# Patient Record
Sex: Male | Born: 1940 | Race: White | Hispanic: No | Smoking: Former smoker
Health system: Southern US, Community
[De-identification: ages and names within clinical notes are randomized; demographics above are authoritative.]

## PROBLEM LIST (undated history)

## (undated) DIAGNOSIS — I4891 Unspecified atrial fibrillation: Secondary | ICD-10-CM

## (undated) DIAGNOSIS — R0609 Other forms of dyspnea: Secondary | ICD-10-CM

## (undated) DIAGNOSIS — J849 Interstitial pulmonary disease, unspecified: Secondary | ICD-10-CM

## (undated) DIAGNOSIS — J449 Chronic obstructive pulmonary disease, unspecified: Secondary | ICD-10-CM

## (undated) DIAGNOSIS — R079 Chest pain, unspecified: Secondary | ICD-10-CM

## (undated) DIAGNOSIS — Z950 Presence of cardiac pacemaker: Secondary | ICD-10-CM

## (undated) DIAGNOSIS — I509 Heart failure, unspecified: Secondary | ICD-10-CM

## (undated) DIAGNOSIS — I10 Essential (primary) hypertension: Secondary | ICD-10-CM

## (undated) DIAGNOSIS — R0600 Dyspnea, unspecified: Secondary | ICD-10-CM

## (undated) HISTORY — PX: CHOLECYSTECTOMY: SHX55

## (undated) HISTORY — DX: Interstitial pulmonary disease, unspecified: J84.9

## (undated) HISTORY — PX: PERMANENT PACEMAKER INSERTION: SHX6023

## (undated) HISTORY — DX: Dyspnea, unspecified: R06.00

## (undated) HISTORY — DX: Presence of cardiac pacemaker: Z95.0

## (undated) HISTORY — DX: Chest pain, unspecified: R07.9

## (undated) HISTORY — DX: Other forms of dyspnea: R06.09

---

## 2012-07-02 ENCOUNTER — Inpatient Hospital Stay (HOSPITAL_COMMUNITY)
Admission: EM | Admit: 2012-07-02 | Discharge: 2012-07-07 | DRG: 291 | Disposition: A | Discharge status: Home / Self Care | Payer: Medicare Other | Attending: Internal Medicine | Admitting: Internal Medicine

## 2012-07-02 ENCOUNTER — Emergency Department (HOSPITAL_COMMUNITY): Payer: Medicare Other

## 2012-07-02 ENCOUNTER — Encounter (HOSPITAL_COMMUNITY): Payer: Self-pay | Admitting: *Deleted

## 2012-07-02 DIAGNOSIS — I10 Essential (primary) hypertension: Secondary | ICD-10-CM | POA: Diagnosis present

## 2012-07-02 DIAGNOSIS — I472 Ventricular tachycardia, unspecified: Secondary | ICD-10-CM | POA: Diagnosis present

## 2012-07-02 DIAGNOSIS — J440 Chronic obstructive pulmonary disease with acute lower respiratory infection: Secondary | ICD-10-CM | POA: Diagnosis present

## 2012-07-02 DIAGNOSIS — I5033 Acute on chronic diastolic (congestive) heart failure: Secondary | ICD-10-CM

## 2012-07-02 DIAGNOSIS — F0390 Unspecified dementia without behavioral disturbance: Secondary | ICD-10-CM | POA: Diagnosis present

## 2012-07-02 DIAGNOSIS — E871 Hypo-osmolality and hyponatremia: Secondary | ICD-10-CM | POA: Diagnosis present

## 2012-07-02 DIAGNOSIS — T45515A Adverse effect of anticoagulants, initial encounter: Secondary | ICD-10-CM | POA: Diagnosis present

## 2012-07-02 DIAGNOSIS — J189 Pneumonia, unspecified organism: Secondary | ICD-10-CM | POA: Diagnosis present

## 2012-07-02 DIAGNOSIS — D649 Anemia, unspecified: Secondary | ICD-10-CM | POA: Diagnosis present

## 2012-07-02 DIAGNOSIS — E876 Hypokalemia: Secondary | ICD-10-CM | POA: Diagnosis present

## 2012-07-02 DIAGNOSIS — I5031 Acute diastolic (congestive) heart failure: Secondary | ICD-10-CM

## 2012-07-02 DIAGNOSIS — I5030 Unspecified diastolic (congestive) heart failure: Principal | ICD-10-CM | POA: Diagnosis present

## 2012-07-02 DIAGNOSIS — Z950 Presence of cardiac pacemaker: Secondary | ICD-10-CM

## 2012-07-02 DIAGNOSIS — J9600 Acute respiratory failure, unspecified whether with hypoxia or hypercapnia: Secondary | ICD-10-CM

## 2012-07-02 DIAGNOSIS — J849 Interstitial pulmonary disease, unspecified: Secondary | ICD-10-CM | POA: Diagnosis present

## 2012-07-02 DIAGNOSIS — D6832 Hemorrhagic disorder due to extrinsic circulating anticoagulants: Secondary | ICD-10-CM

## 2012-07-02 DIAGNOSIS — R0789 Other chest pain: Secondary | ICD-10-CM | POA: Diagnosis present

## 2012-07-02 DIAGNOSIS — J8410 Pulmonary fibrosis, unspecified: Secondary | ICD-10-CM | POA: Diagnosis present

## 2012-07-02 DIAGNOSIS — I4729 Other ventricular tachycardia: Secondary | ICD-10-CM | POA: Diagnosis present

## 2012-07-02 DIAGNOSIS — J209 Acute bronchitis, unspecified: Secondary | ICD-10-CM | POA: Diagnosis present

## 2012-07-02 DIAGNOSIS — I4891 Unspecified atrial fibrillation: Secondary | ICD-10-CM | POA: Diagnosis present

## 2012-07-02 DIAGNOSIS — R791 Abnormal coagulation profile: Secondary | ICD-10-CM | POA: Diagnosis present

## 2012-07-02 DIAGNOSIS — Z22322 Carrier or suspected carrier of Methicillin resistant Staphylococcus aureus: Secondary | ICD-10-CM

## 2012-07-02 DIAGNOSIS — F329 Major depressive disorder, single episode, unspecified: Secondary | ICD-10-CM | POA: Diagnosis present

## 2012-07-02 DIAGNOSIS — Z7901 Long term (current) use of anticoagulants: Secondary | ICD-10-CM

## 2012-07-02 DIAGNOSIS — I509 Heart failure, unspecified: Secondary | ICD-10-CM | POA: Diagnosis present

## 2012-07-02 DIAGNOSIS — M940 Chondrocostal junction syndrome [Tietze]: Secondary | ICD-10-CM | POA: Diagnosis present

## 2012-07-02 DIAGNOSIS — F3289 Other specified depressive episodes: Secondary | ICD-10-CM | POA: Diagnosis present

## 2012-07-02 DIAGNOSIS — Z87891 Personal history of nicotine dependence: Secondary | ICD-10-CM

## 2012-07-02 DIAGNOSIS — J9601 Acute respiratory failure with hypoxia: Secondary | ICD-10-CM | POA: Diagnosis present

## 2012-07-02 HISTORY — DX: Essential (primary) hypertension: I10

## 2012-07-02 HISTORY — DX: Unspecified atrial fibrillation: I48.91

## 2012-07-02 HISTORY — DX: Heart failure, unspecified: I50.9

## 2012-07-02 HISTORY — DX: Chronic obstructive pulmonary disease, unspecified: J44.9

## 2012-07-02 LAB — URINE MICROSCOPIC-ADD ON

## 2012-07-02 LAB — URINALYSIS, ROUTINE W REFLEX MICROSCOPIC
Glucose, UA: NEGATIVE mg/dL
Hgb urine dipstick: NEGATIVE
Ketones, ur: 15 mg/dL — AB
Protein, ur: 30 mg/dL — AB
Urobilinogen, UA: >8 mg/dL — ABNORMAL HIGH (ref 0.0–1.0)

## 2012-07-02 LAB — POCT I-STAT TROPONIN I: Troponin i, poc: 0.01 ng/mL (ref 0.00–0.08)

## 2012-07-02 MED ORDER — ASPIRIN EC 325 MG PO TBEC
325.0000 mg | DELAYED_RELEASE_TABLET | Freq: Once | ORAL | Status: AC
  Administered 2012-07-02: 325 mg via ORAL
  Filled 2012-07-02: qty 1

## 2012-07-02 MED ORDER — FUROSEMIDE 10 MG/ML IJ SOLN
40.0000 mg | Freq: Once | INTRAMUSCULAR | Status: AC
  Administered 2012-07-02: 40 mg via INTRAVENOUS
  Filled 2012-07-02: qty 4

## 2012-07-02 MED ORDER — NITROGLYCERIN IN D5W 200-5 MCG/ML-% IV SOLN
2.0000 ug/min | Freq: Once | INTRAVENOUS | Status: AC
  Administered 2012-07-02: 5 ug/min via INTRAVENOUS
  Filled 2012-07-02: qty 250

## 2012-07-02 MED ORDER — METHYLPREDNISOLONE SODIUM SUCC 125 MG IJ SOLR
125.0000 mg | Freq: Once | INTRAMUSCULAR | Status: AC
  Administered 2012-07-03: 125 mg via INTRAVENOUS
  Filled 2012-07-02: qty 2

## 2012-07-02 MED ORDER — LEVOFLOXACIN IN D5W 750 MG/150ML IV SOLN
750.0000 mg | Freq: Once | INTRAVENOUS | Status: AC
  Administered 2012-07-03: 750 mg via INTRAVENOUS
  Filled 2012-07-02: qty 150

## 2012-07-02 NOTE — ED Notes (Signed)
Family at bedside. 

## 2012-07-02 NOTE — ED Notes (Signed)
MD at bedside. 

## 2012-07-02 NOTE — ED Notes (Signed)
Radiology at bedside

## 2012-07-02 NOTE — ED Provider Notes (Signed)
History     CSN: [id number]  Arrival date & time 07/02/12  2200   First MD Initiated Contact with Patient 07/02/12 2158      Chief Complaint  Patient presents with  . Shortness of Breath    (Consider location/radiation/quality/duration/timing/severity/associated sxs/prior treatment) Patient is a 72 y.o. male presenting with shortness of breath.  Shortness of Breath Severity:  Severe Onset quality:  Gradual Duration:  2 weeks Timing:  Constant Progression:  Worsening Chronicity:  Chronic Context: activity and URI   Relieved by:  Nothing Worsened by:  Exertion, deep breathing and coughing Ineffective treatments:  Inhaler and oxygen Associated symptoms: chest pain and cough   Associated symptoms: no fever, no rash and no sputum production   Chest pain:    Timing:  Intermittent   Progression:  Worsening Risk factors: no hx of PE/DVT and no obesity   Risk factors comment:  COPD, CHF   Past Medical History  Diagnosis Date  . COPD (chronic obstructive pulmonary disease)   . CHF (congestive heart failure)   . Hypertension   . Atrial fibrillation     History reviewed. No pertinent past surgical history.  History reviewed. No pertinent family history.  History  Substance Use Topics  . Smoking status: Not on file  . Smokeless tobacco: Not on file  . Alcohol Use: Not on file    OB History   Grav Para Term Preterm Abortions TAB SAB Ect Mult Living                  Review of Systems  Unable to perform ROS: Acuity of condition  Constitutional: Negative for fever.  HENT: Positive for congestion. Negative for rhinorrhea.   Eyes: Negative for photophobia and visual disturbance.  Respiratory: Positive for cough and shortness of breath. Negative for sputum production.   Cardiovascular: Positive for chest pain. Negative for leg swelling.  Gastrointestinal: Negative for constipation.  Skin: Negative for rash.    Allergies  Penicillins  Home Medications    Current Outpatient Rx  Name  Route  Sig  Dispense  Refill  . Alum & Mag Hydroxide-Simeth (MAGIC MOUTHWASH) SOLN   Oral   Take 15 mLs by mouth every 4 (four) hours as needed (for sore tongue (swish and spit)).         . bumetanide (BUMEX) 2 MG tablet   Oral   Take 2 mg by mouth daily.         . bumetanide (BUMEX) 2 MG tablet   Oral   Take 2 mg by mouth daily as needed. May take additional 2 mg for an increase of 3 lbs. (Take extra potassium, with extra Bumex dose)         . esomeprazole (NEXIUM) 40 MG capsule   Oral   Take 40 mg by mouth daily before breakfast.         . guaiFENesin-codeine (ROBITUSSIN AC) 100-10 MG/5ML syrup   Oral   Take 10 mLs by mouth every 4 (four) hours as needed for cough.         . hydrALAZINE (APRESOLINE) 25 MG tablet   Oral   Take 25 mg by mouth 3 (three) times daily.         Marland Kitchen ipratropium-albuterol (DUONEB) 0.5-2.5 (3) MG/3ML SOLN   Nebulization   Take 3 mLs by nebulization every 4 (four) hours as needed (shortness of breath or wheeze).         . magnesium hydroxide (MILK OF MAGNESIA) 400 MG/5ML  suspension   Oral   Take 30 mLs by mouth daily as needed for constipation (for constipation with oxycodone).         . metoCLOPramide (REGLAN) 10 MG tablet   Oral   Take 5-10 mg by mouth 4 (four) times daily.         . metoprolol succinate (TOPROL-XL) 50 MG 24 hr tablet   Oral   Take 50 mg by mouth 2 (two) times daily. Take with or immediately following a meal.         . oxyCODONE (OXYCONTIN) 10 MG 12 hr tablet   Oral   Take 10 mg by mouth every 4 (four) hours as needed for pain.         . potassium chloride SA (K-DUR,KLOR-CON) 20 MEQ tablet   Oral   Take 40 mEq by mouth daily.         . potassium chloride SA (K-DUR,KLOR-CON) 20 MEQ tablet   Oral   Take 40 mEq by mouth daily as needed (Take an extra 40 meq when taking extra Bumex).         Marland Kitchen sertraline (ZOLOFT) 50 MG tablet   Oral   Take 50 mg by mouth daily.          Marland Kitchen spironolactone (ALDACTONE) 25 MG tablet   Oral   Take 25 mg by mouth daily.         Marland Kitchen warfarin (COUMADIN) 5 MG tablet   Oral   Take 5-7.5 mg by mouth daily. 10 mg on Friday, Saturday, Sunday, and Monday. Take 7.5 mg on Tuesday, Wednesday, and Thursday.           BP 110/59  Pulse 92  Resp 17  SpO2 96%  Physical Exam  Vitals reviewed. Constitutional: She is oriented to person, place, and time. She appears well-developed and well-nourished.  HENT:  Head: Normocephalic and atraumatic.  Right Ear: External ear normal.  Left Ear: External ear normal.  Eyes: Conjunctivae and EOM are normal. Pupils are equal, round, and reactive to light.  Neck: Normal range of motion. Neck supple.  Cardiovascular: Normal rate, regular rhythm, normal heart sounds and intact distal pulses.   Pulmonary/Chest: Accessory muscle usage present. Tachypnea noted. She is in respiratory distress. She has rales in the right middle field, the right lower field, the left middle field and the left lower field. She exhibits tenderness.  Abdominal: Soft. Bowel sounds are normal. There is no tenderness.  Musculoskeletal: Normal range of motion.  Neurological: She is alert and oriented to person, place, and time.  Skin: Skin is warm and dry.    ED Course  Procedures (including critical care time)  Labs Reviewed  CBC WITH DIFFERENTIAL - Abnormal; Notable for the following:    RBC 3.47 (*)    Hemoglobin 11.2 (*)    HCT 33.3 (*)    Neutrophils Relative % 83 (*)    Neutro Abs 8.6 (*)    Lymphocytes Relative 8 (*)    All other components within normal limits  COMPREHENSIVE METABOLIC PANEL - Abnormal; Notable for the following:    Sodium 129 (*)    Potassium 3.2 (*)    Chloride 93 (*)    Glucose, Bld 106 (*)    Albumin 2.9 (*)    Total Bilirubin 1.3 (*)    All other components within normal limits  PRO B NATRIURETIC PEPTIDE - Abnormal; Notable for the following:    Pro B Natriuretic peptide (BNP)  3078.0 (*)  All other components within normal limits  URINALYSIS, ROUTINE W REFLEX MICROSCOPIC - Abnormal; Notable for the following:    Color, Urine AMBER (*)    Bilirubin Urine MODERATE (*)    Ketones, ur 15 (*)    Protein, ur 30 (*)    Urobilinogen, UA >8.0 (*)    All other components within normal limits  URINE MICROSCOPIC-ADD ON - Abnormal; Notable for the following:    Squamous Epithelial / LPF FEW (*)    Bacteria, UA FEW (*)    All other components within normal limits  POCT I-STAT TROPONIN I  CG4 I-STAT (LACTIC ACID)   Dg Chest Port 1 View  07/02/2012   *RADIOLOGY REPORT*  Clinical Data: Shortness of breath.  Respiratory distress.  PORTABLE CHEST - 1 VIEW  Comparison: No priors.  Findings: Mild diffuse interstitial prominence and extensive peribronchial cuffing.  Bibasilar opacities favored to predominately reflect subsegmental atelectasis.  No definite pleural effusions.  Mild congestion of the pulmonary vasculature. Heart size is mildly enlarged. The patient is rotated to the left on today's exam, resulting in distortion of the mediastinal contours and reduced diagnostic sensitivity and specificity for mediastinal pathology.  Atherosclerosis in the thoracic aorta. Left-sided pacemaker device in place with lead tip projecting over the expected location of the right ventricular apex.  IMPRESSION: 1.  Diffuse peribronchial cuffing and interstitial prominence, concerning for severe bronchitis, potentially with developing multifocal bronchopneumonia. 2.  Mild cardiomegaly with pulmonary venous congestion. 3.  Atherosclerosis.   Original Report Authenticated By: Trudie Reed, M.D.     No diagnosis found.   Date: 07/02/2012  Rate: 107  Rhythm: atrial fibrillation  QRS Axis: left  Intervals: qt normal  ST/T Wave abnormalities: normal  Conduction Disutrbances:none  Narrative Interpretation:   Old EKG Reviewed: none available    MDM  72 y.o. male  with pertinent PMH of  afib, COPD, CHF, HTN presents with acute exacerbation of chronic dyspnea for 2 weeks.  Pt states he is compliant with medication, denies fever, gi symptoms, or other symptoms.  Symptoms acutely worsened today, was prescribed albuterol inhaler, but this did not help his symptoms.  On EMS arrival, pt tachypneic, in respiratory distress, pale.  Pt placed on cpap, symptoms mildly improved.  On arrival vitals and physical exam as above with rales bilaterally to mid lung fields, no wheezing.  Pt did have JVD but no pedal edema.  Symptoms of gradual onset.  Doubt PE given clinical scenario, and pt improved with nitro, bipap, and lasix.  BNP elevated.  Initial trop negative.  Given levaquin after cxr demonstrated possible bronchopneumonia.  Also solumedrol given CXR more consistent with COPD exacerbation.  Admitted without change in condition.   Labs and imaging as above reviewed by myself and attending,Dr. Jeraldine Loots, with whom case was discussed.   Clinical impression: Dyspnea CHF exacerbation        Noel Gerold, MD 07/03/12 0300

## 2012-07-02 NOTE — ED Notes (Signed)
Per EMS: pt coming from home with c/o increased shortness of breath. Pt was lying in bed, pale, diaphoretic, severe distress, pt unable to move. Per EMS: rales in all lungs, diminished in lower lobes. Pt had a change in heart rhythm en route, rhythm was A-fib now demand paced. Pt has a pacemaker. Pt placed on CPAP at 2110. Pt's presentation has improved but pt states he does not feel any better. Pt is A&Ox4, respirations are labored. Pt has hx of CHF, COPD. Skin warm and dry

## 2012-07-03 ENCOUNTER — Encounter (HOSPITAL_COMMUNITY): Payer: Self-pay | Admitting: Internal Medicine

## 2012-07-03 ENCOUNTER — Inpatient Hospital Stay (HOSPITAL_COMMUNITY): Payer: Medicare Other

## 2012-07-03 DIAGNOSIS — I509 Heart failure, unspecified: Secondary | ICD-10-CM

## 2012-07-03 DIAGNOSIS — I4891 Unspecified atrial fibrillation: Secondary | ICD-10-CM

## 2012-07-03 DIAGNOSIS — E871 Hypo-osmolality and hyponatremia: Secondary | ICD-10-CM | POA: Diagnosis present

## 2012-07-03 DIAGNOSIS — I5031 Acute diastolic (congestive) heart failure: Secondary | ICD-10-CM

## 2012-07-03 DIAGNOSIS — D6832 Hemorrhagic disorder due to extrinsic circulating anticoagulants: Secondary | ICD-10-CM

## 2012-07-03 DIAGNOSIS — E876 Hypokalemia: Secondary | ICD-10-CM | POA: Diagnosis present

## 2012-07-03 DIAGNOSIS — I10 Essential (primary) hypertension: Secondary | ICD-10-CM | POA: Diagnosis present

## 2012-07-03 DIAGNOSIS — R0789 Other chest pain: Secondary | ICD-10-CM | POA: Diagnosis present

## 2012-07-03 DIAGNOSIS — J9601 Acute respiratory failure with hypoxia: Secondary | ICD-10-CM | POA: Diagnosis present

## 2012-07-03 DIAGNOSIS — J209 Acute bronchitis, unspecified: Secondary | ICD-10-CM | POA: Diagnosis present

## 2012-07-03 DIAGNOSIS — I369 Nonrheumatic tricuspid valve disorder, unspecified: Secondary | ICD-10-CM

## 2012-07-03 LAB — CBC WITH DIFFERENTIAL/PLATELET
Basophils Absolute: 0 10*3/uL (ref 0.0–0.1)
Basophils Absolute: 0 10*3/uL (ref 0.0–0.1)
Basophils Relative: 0 % (ref 0–1)
Eosinophils Absolute: 0 10*3/uL (ref 0.0–0.7)
Eosinophils Relative: 0 % (ref 0–5)
HCT: 33.3 % — ABNORMAL LOW (ref 39.0–52.0)
HCT: 32.3 % — ABNORMAL LOW (ref 39.0–52.0)
Lymphocytes Relative: 8 % — ABNORMAL LOW (ref 12–46)
Lymphs Abs: 0.8 10*3/uL (ref 0.7–4.0)
MCH: 32.3 pg (ref 26.0–34.0)
MCHC: 33.7 g/dL (ref 30.0–36.0)
MCV: 96 fL (ref 78.0–100.0)
Monocytes Absolute: 0.9 10*3/uL (ref 0.1–1.0)
Monocytes Absolute: 0.4 10*3/uL (ref 0.1–1.0)
Neutro Abs: 8.6 10*3/uL — ABNORMAL HIGH (ref 1.7–7.7)
Neutro Abs: 8.2 10*3/uL — ABNORMAL HIGH (ref 1.7–7.7)
Neutrophils Relative %: 90 % — ABNORMAL HIGH (ref 43–77)
RBC: 3.47 MIL/uL — ABNORMAL LOW (ref 4.22–5.81)
RDW: 14.6 % (ref 11.5–15.5)
WBC: 10.3 10*3/uL (ref 4.0–10.5)

## 2012-07-03 LAB — PROTIME-INR: Prothrombin Time: 63.2 seconds — ABNORMAL HIGH (ref 11.6–15.2)

## 2012-07-03 LAB — COMPREHENSIVE METABOLIC PANEL
ALT: 11 U/L (ref 0–53)
ALT: 13 U/L (ref 0–53)
AST: 32 U/L (ref 0–37)
Alkaline Phosphatase: 120 U/L — ABNORMAL HIGH (ref 39–117)
CO2: 23 mEq/L (ref 19–32)
CO2: 21 mEq/L (ref 19–32)
Calcium: 9.1 mg/dL (ref 8.4–10.5)
Chloride: 93 mEq/L — ABNORMAL LOW (ref 96–112)
GFR calc Af Amer: >90 mL/min (ref >90)
GFR calc Af Amer: >90 mL/min (ref >90)
GFR calc non Af Amer: >90 mL/min (ref >90)
GFR calc non Af Amer: >90 mL/min (ref >90)
Glucose, Bld: 106 mg/dL — ABNORMAL HIGH (ref 70–99)
Glucose, Bld: 157 mg/dL — ABNORMAL HIGH (ref 70–99)
Potassium: 3.4 mEq/L — ABNORMAL LOW (ref 3.5–5.1)
Sodium: 129 mEq/L — ABNORMAL LOW (ref 135–145)
Sodium: 130 mEq/L — ABNORMAL LOW (ref 135–145)
Total Bilirubin: 1.3 mg/dL — ABNORMAL HIGH (ref 0.3–1.2)

## 2012-07-03 LAB — POCT I-STAT 3, ART BLOOD GAS (G3+)
Bicarbonate: 24.5 mEq/L — ABNORMAL HIGH (ref 20.0–24.0)
O2 Saturation: 99 %

## 2012-07-03 LAB — PHOSPHORUS: Phosphorus: 2.9 mg/dL (ref 2.3–4.6)

## 2012-07-03 MED ORDER — SODIUM CHLORIDE 0.9 % IJ SOLN
3.0000 mL | Freq: Two times a day (BID) | INTRAMUSCULAR | Status: DC
  Administered 2012-07-03 – 2012-07-07 (×8): 3 mL via INTRAVENOUS

## 2012-07-03 MED ORDER — POTASSIUM CHLORIDE CRYS ER 20 MEQ PO TBCR
40.0000 meq | EXTENDED_RELEASE_TABLET | Freq: Every day | ORAL | Status: DC
  Administered 2012-07-03 – 2012-07-04 (×2): 40 meq via ORAL
  Filled 2012-07-03 (×2): qty 2

## 2012-07-03 MED ORDER — OXYCODONE HCL 5 MG PO TABS
10.0000 mg | ORAL_TABLET | ORAL | Status: DC | PRN
  Administered 2012-07-03 – 2012-07-07 (×11): 10 mg via ORAL
  Filled 2012-07-03 (×2): qty 2
  Filled 2012-07-03: qty 1
  Filled 2012-07-03: qty 2
  Filled 2012-07-03 (×2): qty 1
  Filled 2012-07-03: qty 2
  Filled 2012-07-03: qty 1
  Filled 2012-07-03: qty 2
  Filled 2012-07-03: qty 1
  Filled 2012-07-03 (×3): qty 2

## 2012-07-03 MED ORDER — IBUPROFEN 400 MG PO TABS
400.0000 mg | ORAL_TABLET | Freq: Four times a day (QID) | ORAL | Status: DC | PRN
  Filled 2012-07-03: qty 1

## 2012-07-03 MED ORDER — NITROGLYCERIN IN D5W 200-5 MCG/ML-% IV SOLN
2.0000 ug/min | INTRAVENOUS | Status: DC
  Administered 2012-07-03: 20 ug/min via INTRAVENOUS
  Administered 2012-07-03: 15 ug/min via INTRAVENOUS
  Administered 2012-07-03: 25 ug/min via INTRAVENOUS
  Administered 2012-07-03: 10 ug/min via INTRAVENOUS
  Administered 2012-07-03: 5 ug/min via INTRAVENOUS

## 2012-07-03 MED ORDER — ACETAMINOPHEN 650 MG RE SUPP: 650.0000 mg | Freq: Four times a day (QID) | RECTAL | Status: DC | PRN

## 2012-07-03 MED ORDER — CHLORHEXIDINE GLUCONATE CLOTH 2 % EX PADS
6.0000 | MEDICATED_PAD | Freq: Every day | CUTANEOUS | Status: AC
  Administered 2012-07-03 – 2012-07-07 (×5): 6 via TOPICAL

## 2012-07-03 MED ORDER — IPRATROPIUM BROMIDE 0.02 % IN SOLN
0.5000 mg | Freq: Four times a day (QID) | RESPIRATORY_TRACT | Status: DC
  Administered 2012-07-03 – 2012-07-04 (×5): 0.5 mg via RESPIRATORY_TRACT
  Filled 2012-07-03 (×5): qty 2.5

## 2012-07-03 MED ORDER — MUPIROCIN 2 % EX OINT
1.0000 "application " | TOPICAL_OINTMENT | Freq: Two times a day (BID) | CUTANEOUS | Status: DC
  Administered 2012-07-03 – 2012-07-07 (×9): 1 via NASAL
  Filled 2012-07-03 (×2): qty 22

## 2012-07-03 MED ORDER — HYDROCODONE-HOMATROPINE 5-1.5 MG/5ML PO SYRP
5.0000 mL | ORAL_SOLUTION | ORAL | Status: DC | PRN
  Administered 2012-07-03 – 2012-07-05 (×3): 5 mL via ORAL
  Filled 2012-07-03 (×4): qty 5

## 2012-07-03 MED ORDER — WARFARIN - PHARMACIST DOSING INPATIENT
Freq: Every day | Status: DC
  Administered 2012-07-06: 18:00:00

## 2012-07-03 MED ORDER — METOCLOPRAMIDE HCL 5 MG PO TABS
5.0000 mg | ORAL_TABLET | Freq: Three times a day (TID) | ORAL | Status: DC
  Administered 2012-07-03: 5 mg via ORAL
  Administered 2012-07-03: 10 mg via ORAL
  Administered 2012-07-03 (×2): 5 mg via ORAL
  Administered 2012-07-04: 10 mg via ORAL
  Administered 2012-07-04: 5 mg via ORAL
  Administered 2012-07-04: 10 mg via ORAL
  Administered 2012-07-04: 5 mg via ORAL
  Administered 2012-07-05: 10 mg via ORAL
  Filled 2012-07-03 (×13): qty 2

## 2012-07-03 MED ORDER — LEVALBUTEROL HCL 0.63 MG/3ML IN NEBU
0.6300 mg | INHALATION_SOLUTION | Freq: Four times a day (QID) | RESPIRATORY_TRACT | Status: DC
  Administered 2012-07-03 – 2012-07-04 (×5): 0.63 mg via RESPIRATORY_TRACT
  Filled 2012-07-03 (×6): qty 3

## 2012-07-03 MED ORDER — HYDRALAZINE HCL 25 MG PO TABS
25.0000 mg | ORAL_TABLET | Freq: Three times a day (TID) | ORAL | Status: DC
  Administered 2012-07-03 – 2012-07-07 (×13): 25 mg via ORAL
  Filled 2012-07-03 (×17): qty 1

## 2012-07-03 MED ORDER — ONDANSETRON HCL 4 MG/2ML IJ SOLN: 4.0000 mg | Freq: Four times a day (QID) | INTRAMUSCULAR | Status: DC | PRN

## 2012-07-03 MED ORDER — FUROSEMIDE 10 MG/ML IJ SOLN
40.0000 mg | Freq: Four times a day (QID) | INTRAMUSCULAR | Status: DC
  Administered 2012-07-03: 40 mg via INTRAVENOUS
  Filled 2012-07-03 (×5): qty 4

## 2012-07-03 MED ORDER — ACETAMINOPHEN 325 MG PO TABS: 650.0000 mg | ORAL_TABLET | Freq: Four times a day (QID) | ORAL | Status: DC | PRN

## 2012-07-03 MED ORDER — GI COCKTAIL ~~LOC~~
30.0000 mL | Freq: Once | ORAL | Status: AC
  Administered 2012-07-03: 30 mL via ORAL
  Filled 2012-07-03: qty 30

## 2012-07-03 MED ORDER — PANTOPRAZOLE SODIUM 40 MG PO TBEC
40.0000 mg | DELAYED_RELEASE_TABLET | Freq: Every day | ORAL | Status: DC
  Administered 2012-07-03 – 2012-07-07 (×5): 40 mg via ORAL
  Filled 2012-07-03 (×5): qty 1

## 2012-07-03 MED ORDER — METOPROLOL SUCCINATE ER 50 MG PO TB24
50.0000 mg | ORAL_TABLET | Freq: Two times a day (BID) | ORAL | Status: DC
  Administered 2012-07-03 – 2012-07-04 (×3): 50 mg via ORAL
  Filled 2012-07-03 (×4): qty 1

## 2012-07-03 MED ORDER — FUROSEMIDE 10 MG/ML IJ SOLN
40.0000 mg | Freq: Three times a day (TID) | INTRAMUSCULAR | Status: DC
  Administered 2012-07-03 – 2012-07-04 (×3): 40 mg via INTRAVENOUS
  Filled 2012-07-03 (×3): qty 4

## 2012-07-03 MED ORDER — LEVOFLOXACIN IN D5W 750 MG/150ML IV SOLN
750.0000 mg | INTRAVENOUS | Status: DC
  Administered 2012-07-03 – 2012-07-05 (×3): 750 mg via INTRAVENOUS
  Filled 2012-07-03 (×4): qty 150

## 2012-07-03 MED ORDER — SERTRALINE HCL 50 MG PO TABS
50.0000 mg | ORAL_TABLET | Freq: Every day | ORAL | Status: DC
  Administered 2012-07-03 – 2012-07-07 (×5): 50 mg via ORAL
  Filled 2012-07-03 (×5): qty 1

## 2012-07-03 MED ORDER — ONDANSETRON HCL 4 MG PO TABS: 4.0000 mg | ORAL_TABLET | Freq: Four times a day (QID) | ORAL | Status: DC | PRN

## 2012-07-03 MED ORDER — SODIUM CHLORIDE 0.9 % IJ SOLN
3.0000 mL | Freq: Two times a day (BID) | INTRAMUSCULAR | Status: DC
  Administered 2012-07-03 – 2012-07-04 (×2): 3 mL via INTRAVENOUS

## 2012-07-03 MED ORDER — IOHEXOL 300 MG/ML  SOLN
80.0000 mL | Freq: Once | INTRAMUSCULAR | Status: AC | PRN
  Administered 2012-07-03: 80 mL via INTRAVENOUS

## 2012-07-03 MED ORDER — LEVALBUTEROL HCL 0.63 MG/3ML IN NEBU
0.6300 mg | INHALATION_SOLUTION | Freq: Four times a day (QID) | RESPIRATORY_TRACT | Status: DC | PRN
  Filled 2012-07-03: qty 3

## 2012-07-03 MED ORDER — SPIRONOLACTONE 25 MG PO TABS
25.0000 mg | ORAL_TABLET | Freq: Every day | ORAL | Status: DC
  Administered 2012-07-03 – 2012-07-05 (×3): 25 mg via ORAL
  Filled 2012-07-03 (×4): qty 1

## 2012-07-03 NOTE — Progress Notes (Signed)
Echocardiogram 2D Echocardiogram has been performed.  Arvil Chaco 07/03/2012, 4:02 PM

## 2012-07-03 NOTE — ED Notes (Signed)
BiPap dc'd per Dr. Kirtland Bouchard.

## 2012-07-03 NOTE — ED Provider Notes (Signed)
I saw and evaluated the patient.  I agree with the resident's note (Dr. Littie Deeds)  This patient presents in extremis, actively in respiratory failure.  He was receiving continuous positive airway pressure via EMS assistance device. With the initial presence of tachypnea, tachycardia, oxygen reliance, there is suspicion for either CHF exacerbation or infectious process.  The patient's emergency room course he improved following provision of continuous noninvasive mechanical ventilation, and initiation of albuterol treatments, nitroglycerin via IV, and a nitroglycerin drip.  Following the patient's chest x-ray, the suspicion of infectious etiology with seemed likely as well, and he was provided antibiotics. On multiple repeat examinations  he continually improved.  However, he continued to receive supplemental oxygen throughout. With negative troponin, minimal risk factors for PE, both ischemic cardiac disease and thromboembolic processes seem less likely.  Patient did require admission for further evaluation and management for his presentation of acute respiratory failure.    I saw all relevant labs and ECG, agree with the interpretation.     CRITICAL CARE Performed by: Gerhard Munch Total critical care time: 35 Critical care time was exclusive of separately billable procedures and treating other patients. Critical care was necessary to treat or prevent imminent or life-threatening deterioration. Critical care was time spent personally by me on the following activities: development of treatment plan with patient and/or surrogate as well as nursing, discussions with consultants, evaluation of patient's response to treatment, examination of patient, obtaining history from patient or surrogate, ordering and performing treatments and interventions, ordering and review of laboratory studies, ordering and review of radiographic studies, pulse oximetry and re-evaluation of patient's  condition.   Gerhard Munch, MD 07/04/12 0000

## 2012-07-03 NOTE — Progress Notes (Signed)
Utilization Review Completed. 07/03/2012

## 2012-07-03 NOTE — Progress Notes (Signed)
Paged A. Rennis Harding, NP regarding pt having NTG gtt running at 74mcg/hr with no current orders to do so.  Pt complaining of Chest pressure 10/10 in midsternal area.  New orders placed in EPIC, will continue to monitor.  Salomon Mast, RN

## 2012-07-03 NOTE — H&P (Signed)
[facility] History and Physical  Billy Casey ZOX:[id number] DOB: January 23, 1940 DOA: 07/02/2012  Referring physician: ER physician. PCP: No primary provider on file. Billy Casey in [facility].  Chief Complaint: Shortness of breath.  HPI: Billy Casey is a 72 y.o. male history of A. fib, COPD, hypertension has been experiencing shortness of breath with productive cough for last 3 days. Patient's symptoms has been progressing getting worse. Patient also had some chest pressure yesterday morning which was self-limited and patient's chest pressure started having again in the evening at that time patient presented the ER. In the ER patient was found to be acutely short of breath and was placed on BiPAP and chest x-ray showed features concerning for multifocal pneumonia with possible CHF. Patient also was found to be in A. fib with mildly elevated heart rate. Patient was given one dose of Lasix IV 40 mg and started on Levaquin for pneumonia. On my exam patient felt better and ABG showed no carbon dioxide retention and patient was taken off BiPAP. Patient is still mildly short of breath and will be closely monitored overnight in step down. Denies any chest pain at this time.  Review of Systems: As presented in the history of presenting illness, rest negative.  Past Medical History  Diagnosis Date  . COPD (chronic obstructive pulmonary disease)   . CHF (congestive heart failure)   . Hypertension   . Atrial fibrillation    Past Surgical History  Procedure Laterality Date  . Cholecystectomy    . Permanent pacemaker insertion     Social History:  reports that she has never smoked. She does not have any smokeless tobacco history on file. She reports that she does not drink alcohol or use illicit drugs. Home. where does patient live-- Can do ADLs. Can patient participate in ADLs?  Allergies  Allergen Reactions  . Penicillins     Family History  Problem Relation Age of  Onset  . Stroke Mother   . CAD Father       Prior to Admission medications   Medication Sig Start Date End Date Taking? Authorizing Provider  Alum & Mag Hydroxide-Simeth (MAGIC MOUTHWASH) SOLN Take 15 mLs by mouth every 4 (four) hours as needed (for sore tongue (swish and spit)).   Yes Historical Provider, MD  bumetanide (BUMEX) 2 MG tablet Take 2 mg by mouth daily.   Yes Historical Provider, MD  bumetanide (BUMEX) 2 MG tablet Take 2 mg by mouth daily as needed. May take additional 2 mg for an increase of 3 lbs. (Take extra potassium, with extra Bumex dose)   Yes Historical Provider, MD  esomeprazole (NEXIUM) 40 MG capsule Take 40 mg by mouth daily before breakfast.   Yes Historical Provider, MD  guaiFENesin-codeine (ROBITUSSIN AC) 100-10 MG/5ML syrup Take 10 mLs by mouth every 4 (four) hours as needed for cough.   Yes Historical Provider, MD  hydrALAZINE (APRESOLINE) 25 MG tablet Take 25 mg by mouth 3 (three) times daily.   Yes Historical Provider, MD  ipratropium-albuterol (DUONEB) 0.5-2.5 (3) MG/3ML SOLN Take 3 mLs by nebulization every 4 (four) hours as needed (shortness of breath or wheeze).   Yes Historical Provider, MD  magnesium hydroxide (MILK OF MAGNESIA) 400 MG/5ML suspension Take 30 mLs by mouth daily as needed for constipation (for constipation with oxycodone).   Yes Historical Provider, MD  metoCLOPramide (REGLAN) 10 MG tablet Take 5-10 mg by mouth 4 (four) times daily.   Yes Historical Provider, MD  metoprolol  succinate (TOPROL-XL) 50 MG 24 hr tablet Take 50 mg by mouth 2 (two) times daily. Take with or immediately following a meal.   Yes Historical Provider, MD  oxyCODONE (OXYCONTIN) 10 MG 12 hr tablet Take 10 mg by mouth every 4 (four) hours as needed for pain.   Yes Historical Provider, MD  potassium chloride SA (K-DUR,KLOR-CON) 20 MEQ tablet Take 40 mEq by mouth daily.   Yes Historical Provider, MD  potassium chloride SA (K-DUR,KLOR-CON) 20 MEQ tablet Take 40 mEq by mouth daily  as needed (Take an extra 40 meq when taking extra Bumex).   Yes Historical Provider, MD  sertraline (ZOLOFT) 50 MG tablet Take 50 mg by mouth daily.   Yes Historical Provider, MD  spironolactone (ALDACTONE) 25 MG tablet Take 25 mg by mouth daily.   Yes Historical Provider, MD  warfarin (COUMADIN) 5 MG tablet Take 5-7.5 mg by mouth daily. 10 mg on Friday, Saturday, Sunday, and Monday. Take 7.5 mg on Tuesday, Wednesday, and Thursday.   Yes Historical Provider, MD   Physical Exam: Filed Vitals:   07/02/12 2250 07/02/12 2300 07/02/12 2310 07/03/12 0017  BP: 146/69 99/50 110/59 124/62  Pulse: 89 98 92 95  Resp: 20 23 17    SpO2: 96% 96% 96% 99%     General:  Well-developed and nourished.  Eyes: Anicteric no pallor.  ENT: No discharge from the ears eyes nose mouth.  Neck: No mass felt.  Cardiovascular: S1-S2 heard.   Respiratory: No rhonchi or crepitations.  Abdomen: Soft nontender bowel sounds present.  Skin: No rash.  Musculoskeletal: No edema.  Psychiatric: Appears normal.  Neurologic: Alert awake oriented to time place and person. Moves all extremities.  Labs on Admission:  Basic Metabolic Panel:  Recent Labs Lab 07/02/12 2201  NA 129*  K 3.2*  CL 93*  CO2 23  GLUCOSE 106*  BUN 9  CREATININE 0.50  CALCIUM 9.1   Liver Function Tests:  Recent Labs Lab 07/02/12 2201  AST 32  ALT 11  ALKPHOS 104  BILITOT 1.3*  PROT 6.5  ALBUMIN 2.9*   No results found for this basename: LIPASE, AMYLASE,  in the last 168 hours No results found for this basename: AMMONIA,  in the last 168 hours CBC:  Recent Labs Lab 07/02/12 2201  WBC 10.3  NEUTROABS 8.6*  HGB 11.2*  HCT 33.3*  MCV 96.0  PLT 245   Cardiac Enzymes: No results found for this basename: CKTOTAL, CKMB, CKMBINDEX, TROPONINI,  in the last 168 hours  BNP (last 3 results)  Recent Labs  07/02/12 2201  PROBNP 3078.0*   CBG: No results found for this basename: GLUCAP,  in the last 168  hours  Radiological Exams on Admission: Dg Chest Port 1 View  07/02/2012   *RADIOLOGY REPORT*  Clinical Data: Shortness of breath.  Respiratory distress.  PORTABLE CHEST - 1 VIEW  Comparison: No priors.  Findings: Mild diffuse interstitial prominence and extensive peribronchial cuffing.  Bibasilar opacities favored to predominately reflect subsegmental atelectasis.  No definite pleural effusions.  Mild congestion of the pulmonary vasculature. Heart size is mildly enlarged. The patient is rotated to the left on today's exam, resulting in distortion of the mediastinal contours and reduced diagnostic sensitivity and specificity for mediastinal pathology.  Atherosclerosis in the thoracic aorta. Left-sided pacemaker device in place with lead tip projecting over the expected location of the right ventricular apex.  IMPRESSION: 1.  Diffuse peribronchial cuffing and interstitial prominence, concerning for severe bronchitis, potentially with developing  multifocal bronchopneumonia. 2.  Mild cardiomegaly with pulmonary venous congestion. 3.  Atherosclerosis.   Original Report Authenticated By: Trudie Reed, M.D.    EKG: Independently reviewed. A. fib with RVR.  Assessment/Plan Principal Problem:   Acute respiratory failure Active Problems:   Pneumonia   CHF (congestive heart failure)   Atrial fibrillation with RVR   1. Acute respiratory failure - most likely secondary to a combination of pneumonia and CHF. Patient will be continued on Levaquin and IV Lasix 40 mg every 6 hourly. Check 2-D echo and cardiac markers. Check urine Legionella antigen and strep antigen. 2. A. Fib - heart rate is mildly elevated. Closely monitor in telemetry. Check cardiac enzymes and thyroid function tests. Coumadin per pharmacy. 3. COPD - presently not wheezing. 4. Anemia - baseline hemoglobin not known. Closely follow CBC. 5. Hypertension - continue home medications.    Code Status: Full code.  Family Communication:  Patient's daughter at the bedside.  Disposition Plan: Admit to inpatient.    Billy Casey N. [facility] Pager [phone].  If 7PM-7AM, please contact night-coverage [email or web address] Password [email or web address] 07/03/2012, 1:11 AM

## 2012-07-03 NOTE — Progress Notes (Addendum)
ANTICOAGULATION CONSULT NOTE - Initial Consult  Pharmacy Consult for Levaquin and Coumadin Indication: r/o PNA  and atrial fibrillation  Allergies  Allergen Reactions  . Penicillins     Patient Measurements: Height: 6' (182.9 cm) Weight: 26 lb 3.8 oz (11.9 kg) IBW/kg (Calculated) : 73.1  Vital Signs: Temp: 97.1 F (36.2 C) (06/17 0200) Temp src: Oral (06/17 0200) BP: 100/58 mmHg (06/17 0114) Pulse Rate: 90 (06/17 0114)  Labs:  Recent Labs  07/02/12 2201  HGB 11.2*  HCT 33.3*  PLT 245  CREATININE 0.50    Estimated Creatinine Clearance: 12.3 ml/min (by C-G formula based on Cr of 0.5).   Medical History: Past Medical History  Diagnosis Date  . COPD (chronic obstructive pulmonary disease)   . CHF (congestive heart failure)   . Hypertension   . Atrial fibrillation     Medications:  Prescriptions prior to admission  Medication Sig Dispense Refill  . Alum & Mag Hydroxide-Simeth (MAGIC MOUTHWASH) SOLN Take 15 mLs by mouth every 4 (four) hours as needed (for sore tongue (swish and spit)).      . bumetanide (BUMEX) 2 MG tablet Take 2 mg by mouth daily.      . bumetanide (BUMEX) 2 MG tablet Take 2 mg by mouth daily as needed. May take additional 2 mg for an increase of 3 lbs. (Take extra potassium, with extra Bumex dose)      . esomeprazole (NEXIUM) 40 MG capsule Take 40 mg by mouth daily before breakfast.      . guaiFENesin-codeine (ROBITUSSIN AC) 100-10 MG/5ML syrup Take 10 mLs by mouth every 4 (four) hours as needed for cough.      . hydrALAZINE (APRESOLINE) 25 MG tablet Take 25 mg by mouth 3 (three) times daily.      Marland Kitchen ipratropium-albuterol (DUONEB) 0.5-2.5 (3) MG/3ML SOLN Take 3 mLs by nebulization every 4 (four) hours as needed (shortness of breath or wheeze).      . magnesium hydroxide (MILK OF MAGNESIA) 400 MG/5ML suspension Take 30 mLs by mouth daily as needed for constipation (for constipation with oxycodone).      . metoCLOPramide (REGLAN) 10 MG tablet Take  5-10 mg by mouth 4 (four) times daily.      . metoprolol succinate (TOPROL-XL) 50 MG 24 hr tablet Take 50 mg by mouth 2 (two) times daily. Take with or immediately following a meal.      . oxyCODONE (OXYCONTIN) 10 MG 12 hr tablet Take 10 mg by mouth every 4 (four) hours as needed for pain.      . potassium chloride SA (K-DUR,KLOR-CON) 20 MEQ tablet Take 40 mEq by mouth daily.      . potassium chloride SA (K-DUR,KLOR-CON) 20 MEQ tablet Take 40 mEq by mouth daily as needed (Take an extra 40 meq when taking extra Bumex).      Marland Kitchen sertraline (ZOLOFT) 50 MG tablet Take 50 mg by mouth daily.      Marland Kitchen spironolactone (ALDACTONE) 25 MG tablet Take 25 mg by mouth daily.      Marland Kitchen warfarin (COUMADIN) 5 MG tablet Take 5-7.5 mg by mouth daily. 10 mg on Friday, Saturday, Sunday, and Monday. Take 7.5 mg on Tuesday, Wednesday, and Thursday.        Assessment: 72 yo male admitted with SOB for Levaquin, h/o Afib to continue Coumadin   Goal of Therapy:  INR 2-3 Monitor platelets by anticoagulation protocol: Yes   Plan:  Levaquin 750 mg IV q24h Daily INR  Abbott, Earl Lites  [place] 07/03/2012,2:13 AM  Addendum: INR reported as 8.37 which is above goal. No overt bleeding noted.   Plan: 1. No coumadin today 2. F/u AM INR  Lysle Pearl, PharmD, BCPS Pager # [phone] 07/03/2012 10:15 AM

## 2012-07-03 NOTE — Progress Notes (Signed)
Patient taken of Bipap per MD and tolerating well.  RT will continue to monitor patient.

## 2012-07-03 NOTE — Progress Notes (Signed)
[facility] Progress Note [place] TEAM 1 - Stepdown/ICU TEAM   Blane Worthington Isaacson ZOX:[id number] DOB: 27-Oct-1940 DOA: 07/02/2012 PCP: No primary provider on file.  Brief narrative: 72 year old male patient from [facility] with multiple medical problems including atrial fibrillation, COPD and hypertension. Patient endorsed admitting physician call for 3 days aggressively worsening. Also associated with chest pressure on the morning of admission that resolved with outpatient attempting any treatment measures. Unfortunately the chest pressure recurred and with the shortness of breath patient presented to the ER for treatment. In the ER he was found to be acutely short of breath and hypoxic and placed on BiPAP. Chest x-ray was consistent with either multifocal pneumonia or interstitial lung disease and a question of CHF. Patient was also found to have rapid ventricular response with his underlying atrial fibrillation. He was given a dose of IV Lasix in the emergency department and started on Levaquin for pneumonia. At that time the admitting physician arrived to evaluate the patient he had improved significantly and was able to be weaned off of BiPAP.  Assessment/Plan: Active Problems:   Acute respiratory failure with hypoxia due to:   ? HCAP (healthcare-associated pneumonia)   CHF (congestive heart failure)   COPD with acute bronchitis -endorsed cough present x > 1 month with productive yellow sputum -also recent hospitalization in [place] ~5 weeks ago -cont Levaquin - obtain sputum culture -also h/o of CHF so will obtain records and follow up on ECHO ordered this admit -noted to have JVD- cont Lasix 40 q 8hrs and Aldoactone (was on Bumex and Aldactone at home) -no wheezing so no indication for steroids- cont nebs-pt endorsed started smoking age 76 but quit age 29 -definitive cause of resp failure not clear so given CXR appearance of ILD will check CT Chest    Atrial  fibrillation with RVR  -rate better controlled with correction of hypoxia -cont Toprol XL    Warfarin-induced coagulopathy -INR 8.3 so hold Coumadin -No signs of active bleeding -Pharmacy managing    HTN (hypertension) -BP controlled    Hyponatremia/Hypokalemia -slight increase in Na+ after Lasix so will follow -replete K prn -check phosphorus and Mg    Chest pain, atypical/chronic -more c/w costochondritis- add Kpad -as precaution follow enzymes and ECHO (re: RWMA) -EKG 's have been non ischemic -pt has PPM so ? History of CAD?    DVT prophylaxis: Coumadin was supratherapeutic INR Code Status: Full Family Communication: Patient only Disposition Plan: Stepdown Isolation: Contact isolation for MRSA PCR positive status Nutritional Status: Does not appear to have protein calorie malnutrition-acute shift and albumin secondary to presumed heart failure  Consultants: None  Procedures: 2-D echocardiogram pending  Antibiotics: Levaquin 6/17 >>>  HPI/Subjective: Patient awake, somewhat hard of hearing. Describes issues with cough for greater than 4-5 weeks which has been associated with chest discomfort, occasional productive sputum yellow in nature and subjective sensation of fevers as well as diaphoresis. Denied lower extremity edema which is consistent with current exam.   Objective: Blood pressure 120/86, pulse 108, temperature 97.4 F (36.3 C), temperature source Axillary, resp. rate 25, height 6' (1.829 m), weight 111.9 kg (246 lb 11.1 oz), SpO2 93.00%.  Intake/Output Summary (Last 24 hours) at 07/03/12 1039 Last data filed at 07/03/12 0744  Gross per 24 hour  Intake      0 ml  Output    550 ml  Net   -550 ml     Exam: Followup exam completed. Patient admitted this morning at  1:11 AM  Scheduled Meds: Scheduled Meds: . Chlorhexidine Gluconate Cloth  6 each Topical Q0600  . furosemide  40 mg Intravenous Q8H  . hydrALAZINE  25 mg Oral Q8H  . ipratropium   0.5 mg Nebulization Q6H  . levalbuterol  0.63 mg Nebulization Q6H  . levofloxacin (LEVAQUIN) IV  750 mg Intravenous Q24H  . metoCLOPramide  5-10 mg Oral TID AC & HS  . metoprolol succinate  50 mg Oral BID  . mupirocin ointment  1 application Nasal BID  . pantoprazole  40 mg Oral Daily  . potassium chloride SA  40 mEq Oral Daily  . sertraline  50 mg Oral Daily  . sodium chloride  3 mL Intravenous Q12H  . sodium chloride  3 mL Intravenous Q12H  . spironolactone  25 mg Oral Daily  . Warfarin - Pharmacist Dosing Inpatient   Does not apply q1800   Continuous Infusions: . nitroGLYCERIN 5 mcg/min (07/03/12 1039)    Data Reviewed: Basic Metabolic Panel:  Recent Labs Lab 07/02/12 2201 07/03/12 0845  NA 129* 130*  K 3.2* 3.4*  CL 93* 92*  CO2 23 21  GLUCOSE 106* 157*  BUN 9 9  CREATININE 0.50 0.57  CALCIUM 9.1 8.9   Liver Function Tests:  Recent Labs Lab 07/02/12 2201 07/03/12 0845  AST 32 36  ALT 11 13  ALKPHOS 104 120*  BILITOT 1.3* 1.1  PROT 6.5 6.5  ALBUMIN 2.9* 2.8*   No results found for this basename: LIPASE, AMYLASE,  in the last 168 hours No results found for this basename: AMMONIA,  in the last 168 hours CBC:  Recent Labs Lab 07/02/12 2201 07/03/12 0845  WBC 10.3 9.1  NEUTROABS 8.6* 8.2*  HGB 11.2* 10.9*  HCT 33.3* 32.3*  MCV 96.0 95.8  PLT 245 235   Cardiac Enzymes:  Recent Labs Lab 07/03/12 0250 07/03/12 0845  TROPONINI <0.30 <0.30   BNP (last 3 results)  Recent Labs  07/02/12 2201  PROBNP 3078.0*   CBG: No results found for this basename: GLUCAP,  in the last 168 hours  Recent Results (from the past 240 hour(s))  MRSA PCR SCREENING     Status: Abnormal   Collection Time    07/03/12  1:56 AM      Result Value Range Status   MRSA by PCR POSITIVE (*) NEGATIVE Final   Comment:            The GeneXpert MRSA Assay (FDA     approved for NASAL specimens     only), is one component of a     comprehensive MRSA colonization      surveillance program. It is not     intended to diagnose MRSA     infection nor to guide or     monitor treatment for     MRSA infections.     RESULT CALLED TO, READ BACK BY AND VERIFIED WITH:     C.REID,RN 1610 07/03/12 M.CAMPBELL     Studies:  Recent x-ray studies have been reviewed in detail by the Attending Physician  Scheduled Meds:  Reviewed in detail by the Attending Physician   Junious Silk, ANP [facility] Office  [phone] Pager [phone]  **If unable to reach the above provider after paging please contact the Flow Manager @ [phone]  On-Call/Text Page:      [email or web address]      password [email or web address]  If 7PM-7AM, please contact night-coverage [email or web address] Password [email or web address] 07/03/2012, 10:39 AM   LOS: 1 day  I have examined the patient, reviewed the chart and modified the above note which I agree with.   Maurisio Ruddy,MD [phone] 07/03/2012, 4:01 PM

## 2012-07-04 DIAGNOSIS — J849 Interstitial pulmonary disease, unspecified: Secondary | ICD-10-CM | POA: Diagnosis present

## 2012-07-04 DIAGNOSIS — I472 Ventricular tachycardia: Secondary | ICD-10-CM | POA: Diagnosis present

## 2012-07-04 DIAGNOSIS — J9600 Acute respiratory failure, unspecified whether with hypoxia or hypercapnia: Secondary | ICD-10-CM

## 2012-07-04 DIAGNOSIS — J8410 Pulmonary fibrosis, unspecified: Secondary | ICD-10-CM

## 2012-07-04 LAB — CBC
HCT: 31.7 % — ABNORMAL LOW (ref 39.0–52.0)
Hemoglobin: 10.5 g/dL — ABNORMAL LOW (ref 13.0–17.0)
MCH: 32.1 pg (ref 26.0–34.0)
MCHC: 33.1 g/dL (ref 30.0–36.0)
MCV: 96.9 fL (ref 78.0–100.0)
RDW: 14.8 % (ref 11.5–15.5)

## 2012-07-04 LAB — LEGIONELLA ANTIGEN, URINE

## 2012-07-04 LAB — COMPREHENSIVE METABOLIC PANEL
Albumin: 2.8 g/dL — ABNORMAL LOW (ref 3.5–5.2)
Alkaline Phosphatase: 155 U/L — ABNORMAL HIGH (ref 39–117)
BUN: 14 mg/dL (ref 6–23)
Calcium: 9 mg/dL (ref 8.4–10.5)
Creatinine, Ser: 0.68 mg/dL (ref 0.50–1.35)
GFR calc Af Amer: >90 mL/min (ref >90)
Glucose, Bld: 125 mg/dL — ABNORMAL HIGH (ref 70–99)
Total Protein: 6.2 g/dL (ref 6.0–8.3)

## 2012-07-04 LAB — PROTIME-INR: INR: 6.33 (ref 0.00–1.49)

## 2012-07-04 LAB — SEDIMENTATION RATE: Sed Rate: 75 mm/hr — ABNORMAL HIGH (ref 0–16)

## 2012-07-04 LAB — RHEUMATOID FACTOR: Rhuematoid fact SerPl-aCnc: 13 IU/mL (ref <=14)

## 2012-07-04 LAB — ANGIOTENSIN CONVERTING ENZYME: Angiotensin-Converting Enzyme: 14 U/L (ref 8–52)

## 2012-07-04 MED ORDER — MAGNESIUM SULFATE 40 MG/ML IJ SOLN
2.0000 g | Freq: Once | INTRAMUSCULAR | Status: AC
  Administered 2012-07-04: 2 g via INTRAVENOUS
  Filled 2012-07-04 (×2): qty 50

## 2012-07-04 MED ORDER — METOPROLOL SUCCINATE ER 50 MG PO TB24
50.0000 mg | ORAL_TABLET | Freq: Every day | ORAL | Status: DC
  Administered 2012-07-05 – 2012-07-07 (×3): 50 mg via ORAL
  Filled 2012-07-04 (×3): qty 1

## 2012-07-04 MED ORDER — FUROSEMIDE 10 MG/ML IJ SOLN
40.0000 mg | Freq: Two times a day (BID) | INTRAMUSCULAR | Status: DC
  Administered 2012-07-04 – 2012-07-06 (×4): 40 mg via INTRAVENOUS
  Filled 2012-07-04 (×4): qty 4

## 2012-07-04 MED ORDER — IPRATROPIUM BROMIDE 0.02 % IN SOLN
0.5000 mg | Freq: Two times a day (BID) | RESPIRATORY_TRACT | Status: DC
  Administered 2012-07-04 – 2012-07-07 (×6): 0.5 mg via RESPIRATORY_TRACT
  Filled 2012-07-04 (×6): qty 2.5

## 2012-07-04 MED ORDER — LEVALBUTEROL HCL 0.63 MG/3ML IN NEBU
0.6300 mg | INHALATION_SOLUTION | Freq: Two times a day (BID) | RESPIRATORY_TRACT | Status: DC
Start: 2012-07-04 — End: 2012-07-07
  Administered 2012-07-04 – 2012-07-07 (×6): 0.63 mg via RESPIRATORY_TRACT
  Filled 2012-07-04 (×10): qty 3

## 2012-07-04 MED ORDER — POTASSIUM CHLORIDE CRYS ER 20 MEQ PO TBCR
40.0000 meq | EXTENDED_RELEASE_TABLET | Freq: Two times a day (BID) | ORAL | Status: DC
  Administered 2012-07-04 – 2012-07-07 (×6): 40 meq via ORAL
  Filled 2012-07-04 (×9): qty 2

## 2012-07-04 MED ORDER — METHYLPREDNISOLONE SODIUM SUCC 40 MG IJ SOLR
40.0000 mg | Freq: Two times a day (BID) | INTRAMUSCULAR | Status: DC
  Administered 2012-07-04 – 2012-07-06 (×4): 40 mg via INTRAVENOUS
  Filled 2012-07-04 (×6): qty 1

## 2012-07-04 NOTE — Consult Note (Addendum)
PULMONARY  / CRITICAL CARE MEDICINE  Name: Billy Casey MRN: [id number] DOB: 27-Jul-1940    ADMISSION DATE:  07/02/2012 CONSULTATION DATE:  07/04/12  REFERRING MD :  Butler Denmark PRIMARY SERVICE:  [facility]  CHIEF COMPLAINT:  Dyspnea, ?ILD  BRIEF PATIENT DESCRIPTION: 72 yo male with hx COPD, CHF, Afib admitted by [facility] with ?PNA +/- CHF.  CT chest with ILD and PCCM consulted.   SIGNIFICANT EVENTS / STUDIES:  CT chest 6/17>>>Tree-in-bud nodularity, right upper lobe predominant, suspicious for pneumonia. Atypical/viral infection is possible. Suspected chronic interstitial lung disease with chronic bronchitis in the bilateral lower lobes. Cardiomegaly. No frank interstitial edema. Mediastinal lymphadenopathy, likely reactive.   LINES / TUBES: none  CULTURES: none  ANTIBIOTICS: Levaquin 6/16>>>  HISTORY OF PRESENT ILLNESS:  72 yo male with hx COPD, CHF, Afib presented 6/16 with increasing SOB and non productive cough.  Admitted by [facility] with ?PNA +/- CHF.  CT chest with ?ILD.  Pt denies fever, chills, hemoptysis.  No family hx autoimmune disease.  Remote smoking hx, quit in late 60's.  Was a Chartered certified accountant.  No other known environmental risk factors.  Has never seen pulmonary, doubts ever had CXR before.  Is feeling some better but remains much more SOB than usual.    PAST MEDICAL HISTORY :  Past Medical History  Diagnosis Date  . COPD (chronic obstructive pulmonary disease)   . CHF (congestive heart failure)   . Hypertension   . Atrial fibrillation    Past Surgical History  Procedure Laterality Date  . Cholecystectomy    . Permanent pacemaker insertion     Prior to Admission medications   Medication Sig Start Date End Date Taking? Authorizing Provider  Alum & Mag Hydroxide-Simeth (MAGIC MOUTHWASH) SOLN Take 15 mLs by mouth every 4 (four) hours as needed (for sore tongue (swish and spit)).   Yes Historical Provider, MD  bumetanide (BUMEX) 2 MG tablet Take 2 mg by mouth daily.   Yes  Historical Provider, MD  bumetanide (BUMEX) 2 MG tablet Take 2 mg by mouth daily as needed. May take additional 2 mg for an increase of 3 lbs. (Take extra potassium, with extra Bumex dose)   Yes Historical Provider, MD  esomeprazole (NEXIUM) 40 MG capsule Take 40 mg by mouth daily before breakfast.   Yes Historical Provider, MD  guaiFENesin-codeine (ROBITUSSIN AC) 100-10 MG/5ML syrup Take 10 mLs by mouth every 4 (four) hours as needed for cough.   Yes Historical Provider, MD  hydrALAZINE (APRESOLINE) 25 MG tablet Take 25 mg by mouth 3 (three) times daily.   Yes Historical Provider, MD  ipratropium-albuterol (DUONEB) 0.5-2.5 (3) MG/3ML SOLN Take 3 mLs by nebulization every 4 (four) hours as needed (shortness of breath or wheeze).   Yes Historical Provider, MD  magnesium hydroxide (MILK OF MAGNESIA) 400 MG/5ML suspension Take 30 mLs by mouth daily as needed for constipation (for constipation with oxycodone).   Yes Historical Provider, MD  metoCLOPramide (REGLAN) 10 MG tablet Take 5-10 mg by mouth 4 (four) times daily.   Yes Historical Provider, MD  metoprolol succinate (TOPROL-XL) 50 MG 24 hr tablet Take 50 mg by mouth 2 (two) times daily. Take with or immediately following a meal.   Yes Historical Provider, MD  oxyCODONE (OXYCONTIN) 10 MG 12 hr tablet Take 10 mg by mouth every 4 (four) hours as needed for pain.   Yes Historical Provider, MD  potassium chloride SA (K-DUR,KLOR-CON) 20 MEQ tablet Take 40 mEq by mouth daily.   Yes  Historical Provider, MD  potassium chloride SA (K-DUR,KLOR-CON) 20 MEQ tablet Take 40 mEq by mouth daily as needed (Take an extra 40 meq when taking extra Bumex).   Yes Historical Provider, MD  sertraline (ZOLOFT) 50 MG tablet Take 50 mg by mouth daily.   Yes Historical Provider, MD  spironolactone (ALDACTONE) 25 MG tablet Take 25 mg by mouth daily.   Yes Historical Provider, MD  warfarin (COUMADIN) 5 MG tablet Take 5-7.5 mg by mouth daily. 10 mg on Friday, Saturday, Sunday, and  Monday. Take 7.5 mg on Tuesday, Wednesday, and Thursday.   Yes Historical Provider, MD   Allergies  Allergen Reactions  . Penicillins     FAMILY HISTORY:  Family History  Problem Relation Age of Onset  . Stroke Mother   . CAD Father    SOCIAL HISTORY:  reports that he has never smoked. He does not have any smokeless tobacco history on file. He reports that he does not drink alcohol or use illicit drugs.  REVIEW OF SYSTEMS:   As per HPI.  All other systems reviewed and were neg.   VITAL SIGNS: Temp:  [97.1 F (36.2 C)-97.9 F (36.6 C)] 97.1 F (36.2 C) (06/18 0748) Pulse Rate:  [52-105] 82 (06/18 0928) Resp:  [15-28] 23 (06/18 0748) BP: (112-139)/(61-88) 121/69 mmHg (06/18 0928) SpO2:  [92 %-97 %] 92 % (06/18 0748) Weight:  [246 lb 11.1 oz (111.9 kg)] 246 lb 11.1 oz (111.9 kg) (06/18 0451)  PHYSICAL EXAMINATION: General:  Pleasant male, NAD Neuro:  Awake, alert, appropriate, MAE HEENT:  Mm dry, no JVD Cardiovascular:  s1s2 irreg Lungs:  resps even non labored on [place], few scattered dry crackles, no audible wheeze Abdomen:  Soft, +bs Musculoskeletal:  Scant BLE edema    Recent Labs Lab 07/02/12 2201 07/03/12 0845 07/04/12 0845  NA 129* 130* 132*  K 3.2* 3.4* 3.4*  CL 93* 92* 95*  CO2 23 21 27   BUN 9 9 14   CREATININE 0.50 0.57 0.68  GLUCOSE 106* 157* 125*    Recent Labs Lab 07/02/12 2201 07/03/12 0845 07/04/12 0845  HGB 11.2* 10.9* 10.5*  HCT 33.3* 32.3* 31.7*  WBC 10.3 9.1 9.5  PLT 245 235 292   Dg Chest Port 1 View  07/02/2012   *RADIOLOGY REPORT*  Clinical Data: Shortness of breath.  Respiratory distress.  PORTABLE CHEST - 1 VIEW  Comparison: No priors.  Findings: Mild diffuse interstitial prominence and extensive peribronchial cuffing.  Bibasilar opacities favored to predominately reflect subsegmental atelectasis.  No definite pleural effusions.  Mild congestion of the pulmonary vasculature. Heart size is mildly enlarged. The patient is rotated to the  left on today's exam, resulting in distortion of the mediastinal contours and reduced diagnostic sensitivity and specificity for mediastinal pathology.  Atherosclerosis in the thoracic aorta. Left-sided pacemaker device in place with lead tip projecting over the expected location of the right ventricular apex.  IMPRESSION: 1.  Diffuse peribronchial cuffing and interstitial prominence, concerning for severe bronchitis, potentially with developing multifocal bronchopneumonia. 2.  Mild cardiomegaly with pulmonary venous congestion. 3.  Atherosclerosis.   Original Report Authenticated By: Trudie Reed, M.D.    ASSESSMENT / PLAN:  Dyspnea/ ILD -- Doubt CAP +/- mild CHF with likely ILD.  CT chest with chronic changes-although not typical but suggestive of IPF.  No obvious environmental factors.  No hx amiodarone, etc. Remote smoking hx.  PLAN -  Autoimmune w/u- RA factor, ESR, ANA, ACE level, hypersens panel Doubt biopsy will be necessary here -  would like to see old CXR reports from [place] when obtained Cont O2  Keep dry as tol  Cont CHF w/u per primary  Cont BD although no obvious bronchospasm  He reports h/o delerium with prednisone, will trial solumedrol 40 q 12h but stop if he has any such side effects Cont rx for CAP, Chk urine strep ag outpt f/u -- he is planning to stay in [place] for a while with his daughter but ultimately will go home to [place].    PVCs/ diastolic dysfunction/ Atrial Fibn - per IM, replete lytes, no toprol, drop lasix to 40 q 12h until K repleted  Coagulopathy - coumadin on hold  [facility], NP 07/04/2012  12:31 PM Pager: [phone] or [phone]  *Care during the described time interval was provided by me and/or other providers on the critical care team. I have reviewed this patient's available data, including medical history, events of note, physical examination and test results as part of my evaluation.  Cyril Mourning MD. Tonny Bollman. [place]  Pulmonary & Critical care Pager [phone] If no response call [phone]

## 2012-07-04 NOTE — Progress Notes (Signed)
@   600 pt rhythm in ventricular trigeminy. Pt A & O x 4 , pt states "feels fine". Pt baseline atrial fibrillation. Pt having few to occasional PVC's. Notified S. Newton. No new orders received.

## 2012-07-04 NOTE — Progress Notes (Signed)
CRITICAL VALUE ALERT  Critical value received:  INR 6.33   Date of notification:  07/04/2012  Time of notification:  1028  Critical value read back:yes  Nurse who received alert:  TJairo Ben RN  MD notified (1st page):  Revonda Standard, NP  Time of first page:  1035  MD notified (2nd page):  Time of second page:  Responding MD:  Revonda Standard, NP Time MD responded:  [phone]

## 2012-07-04 NOTE — Evaluation (Signed)
Received call from nurse about pt with intermittent runs of trigeminy in setting of baseline afib. Rate controlled with metoprolol. Asymptomatic currently. VSS.  On coumadin.  Had ECHO completed yesterday (results below). Essentially normal study. Will continue to follow and monitor. Cards c/s if persists and pt becomes symptomatic.    2D ECHO Study Conclusions  - Left ventricle: The cavity size was normal. Wall thickness was normal. Systolic function was normal. The estimated ejection fraction was in the range of 60% to 65%. The study is not technically sufficient to allow evaluation of LV diastolic function. - Left atrium: The atrium was severely dilated. - Right ventricle: The cavity size was mildly dilated. - Right atrium: The atrium was severely dilated. - Pulmonary arteries: Systolic pressure was mildly to moderately increased. PA peak pressure: 45mm Hg (S).

## 2012-07-04 NOTE — Progress Notes (Signed)
[facility] Progress Note [place] TEAM 1 - Stepdown/ICU TEAM   Billy Casey AVW:[id number] DOB: 05-Mar-1940 DOA: 07/02/2012 PCP: No primary provider on file.  Brief narrative: 72 year old male patient from [place], [place] with multiple medical problems including chronic atrial fibrillation, COPD and hypertension. Patient endorsed to the admitting physician SOB for 3 days which had been progressively worsening. Also associated with chest pressure that spontaneously resolved. Unfortunately the chest pressure recurred and because of the shortness of breath the patient presented to the ER for treatment.   In the ER he was found to be acutely short of breath and hypoxic and placed on BiPAP. Chest x-ray was consistent with either multifocal pneumonia or interstitial lung disease and a question of CHF. Patient was also found to have rapid ventricular response with his underlying atrial fibrillation. He was given a dose of IV Lasix in the emergency department and started on Levaquin for pneumonia. At the time the admitting physician arrived to evaluate the patient he had improved significantly and was able to be weaned off of BiPAP.  Assessment/Plan:  Acute respiratory failure with hypoxia due to:   A) RUL PNA   B) ?? CHF (congestive heart failure)/?? Diastolic dysfunction   C) Newly appreciated ILD   D) COPD with acute bronchitis -endorsed cough present x > 1 month with productive yellow sputum -also recent hospitalization in [place]  ~5 weeks ago -cont Levaquin - obtain sputum culture -also h/o of CHF so will obtain records and follow up on ECHO ordered this admit -cont Lasix 40 q 8hrs and Aldactone (was on Bumex and Aldactone at home) -cont nebs-pt endorsed started smoking age 32 but quit age 68 -definitive cause of resp failure not clear -CXR appearance of ILD  -CT Chest c/w ILD and likely RUL PNA and ?? Atypical viral pneumonitis -Pulmonary consulted -dtr says was not  eating or drinking well for past 2 -3 weeks  Atrial fibrillation with RVR  -rate better controlled with correction of hypoxia  Warfarin-induced coagulopathy -INR 8.3  (6/17) so held Coumadin- down to ~6 today -No signs of active bleeding -Pharmacy managing -dtr says INR has been variable pre admit-will find out co pay for Xarelto  HTN (hypertension) -BP controlled  Hyponatremia -slight increase in Na+ after Lasix so will follow  NSVT/Hypokalemia/Hypomagnesemia -Increase replete K from daily to BID -Mg 1.7 so IV replete and follow labs  Chest pain, atypical/chronic -more c/w costochondritis- cont Kpad -enzymes negative and ECHO without RWMA -EKG 's have been non ischemic  ? Dementia/depression -apparent progressive memory loss that has worsened somewhat with acute illness -follow -dtr says PCP started Zoloft this past Monday   DVT prophylaxis: Coumadin was supratherapeutic INR Code Status: Full Family Communication: Patient - called dgtr Dorisann Frames and updated on status Disposition Plan: Stepdown Isolation: Contact isolation for MRSA PCR positive status  Consultants: Pulmonary medicine  Procedures: 2-D echocardiogram  - Left ventricle: The cavity size was normal. Wall thickness was normal. Systolic function was normal. The estimated ejection fraction was in the range of 60% to 65%. The study is not technically sufficient to allow evaluation of LV diastolic function. - Left atrium: The atrium was severely dilated. - Right ventricle: The cavity size was mildly dilated. - Right atrium: The atrium was severely dilated. - Pulmonary arteries: Systolic pressure was mildly to moderately increased. PA peak pressure: 45mm Hg (S).   Antibiotics: Levaquin 6/17 >>>  HPI/Subjective: Patient awake, still SOB and endorses feels "terrible"- feels like needs to cough  up sputum but unable; has chest pressure again   Objective: Blood pressure 121/69, pulse 82, temperature 97.1 F  (36.2 C), temperature source Oral, resp. rate 23, height 6' (1.829 m), weight 111.9 kg (246 lb 11.1 oz), SpO2 92.00%.  Intake/Output Summary (Last 24 hours) at 07/04/12 1136 Last data filed at 07/04/12 2130  Gross per 24 hour  Intake    353 ml  Output   1575 ml  Net  -1222 ml     Exam: General: No acute respiratory distress while at rest Lungs: Coarse to auscultation bilaterally with scattered fine crackles, 2L- no wheeze Cardiovascular: Irregular rhythm without murmur gallop or rub normal S1 and S2, no peripheral edema or JVD, frequent PVC's occ multifocal and has had several short bursts of NSVT Abdomen: Nontender, nondistended, soft, bowel sounds positive, no rebound, no ascites, no appreciable mass Musculoskeletal: No significant cyanosis, clubbing of bilateral lower extremities Neurological: Alert and oriented x 3, moves all extremities x 4 without focal neurological deficits, CN 2-12 intact-?? HOH  Scheduled Meds: Scheduled Meds: . Chlorhexidine Gluconate Cloth  6 each Topical Q0600  . furosemide  40 mg Intravenous Q8H  . hydrALAZINE  25 mg Oral Q8H  . ipratropium  0.5 mg Nebulization BID  . levalbuterol  0.63 mg Nebulization BID  . levofloxacin (LEVAQUIN) IV  750 mg Intravenous Q24H  . magnesium sulfate 1 - 4 g bolus IVPB  2 g Intravenous Once  . metoCLOPramide  5-10 mg Oral TID AC & HS  . metoprolol succinate  50 mg Oral BID  . mupirocin ointment  1 application Nasal BID  . pantoprazole  40 mg Oral Daily  . potassium chloride SA  40 mEq Oral BID  . sertraline  50 mg Oral Daily  . sodium chloride  3 mL Intravenous Q12H  . sodium chloride  3 mL Intravenous Q12H  . spironolactone  25 mg Oral Daily  . Warfarin - Pharmacist Dosing Inpatient   Does not apply q1800   Data Reviewed: Basic Metabolic Panel:  Recent Labs Lab 07/02/12 2201 07/03/12 0845 07/04/12 0845  NA 129* 130* 132*  K 3.2* 3.4* 3.4*  CL 93* 92* 95*  CO2 23 21 27   GLUCOSE 106* 157* 125*  BUN 9 9 14    CREATININE 0.50 0.57 0.68  CALCIUM 9.1 8.9 9.0  MG  --  1.7  --   PHOS  --  2.9  --    Liver Function Tests:  Recent Labs Lab 07/02/12 2201 07/03/12 0845 07/04/12 0845  AST 32 36 39*  ALT 11 13 17   ALKPHOS 104 120* 155*  BILITOT 1.3* 1.1 0.6  PROT 6.5 6.5 6.2  ALBUMIN 2.9* 2.8* 2.8*   CBC:  Recent Labs Lab 07/02/12 2201 07/03/12 0845 07/04/12 0845  WBC 10.3 9.1 9.5  NEUTROABS 8.6* 8.2*  --   HGB 11.2* 10.9* 10.5*  HCT 33.3* 32.3* 31.7*  MCV 96.0 95.8 96.9  PLT 245 235 292   Cardiac Enzymes:  Recent Labs Lab 07/03/12 0250 07/03/12 0845 07/03/12 1419  TROPONINI <0.30 <0.30 <0.30   BNP (last 3 results)  Recent Labs  07/02/12 2201  PROBNP 3078.0*     Recent Results (from the past 240 hour(s))  MRSA PCR SCREENING     Status: Abnormal   Collection Time    07/03/12  1:56 AM      Result Value Range Status   MRSA by PCR POSITIVE (*) NEGATIVE Final   Comment:  The GeneXpert MRSA Assay (FDA     approved for NASAL specimens     only), is one component of a     comprehensive MRSA colonization     surveillance program. It is not     intended to diagnose MRSA     infection nor to guide or     monitor treatment for     MRSA infections.     RESULT CALLED TO, READ BACK BY AND VERIFIED WITH:     C.REID,RN 4540 07/03/12 M.CAMPBELL     Studies:  Recent x-ray studies have been reviewed in detail by the Attending Physician   Junious Silk, ANP [facility] Office  [phone] Pager [phone]  **If unable to reach the above provider after paging please contact the Flow Manager @ [phone]  On-Call/Text Page:      [email or web address]      password [email or web address]  If 7PM-7AM, please contact night-coverage [email or web address] Password [email or web address] 07/04/2012, 11:36 AM   LOS: 2 days   I have personally examined this patient and reviewed the entire database. I have reviewed the above note, made any necessary editorial changes, and agree with its content.  Lonia Blood, MD [facility]

## 2012-07-04 NOTE — Evaluation (Signed)
Physical Therapy Evaluation Patient Details Name: Billy Casey MRN: [id number] DOB: September 28, 1940 Today's Date: 07/04/2012 Time: 1478-2956 PT Time Calculation (min): 16 min  PT Assessment / Plan / Recommendation Clinical Impression  Pt is a 72 yo male admitted for SOB. Pt OOB mobility limited this date due to onset of Vtach with sitting up to EOB. RN asked to hold further mobility at this time. Will assess OOB mobility as able and make formal recommendations for d/c. Anticipate pt to be safe for d/c home with family assist 24/7.    PT Assessment  Patient needs continued PT services    Follow Up Recommendations  Supervision/Assistance - 24 hour;Home health PT (if pt progresses)    Does the patient have the potential to tolerate intense rehabilitation      Barriers to Discharge None      Equipment Recommendations  None recommended by PT    Recommendations for Other Services     Frequency Min 3X/week    Precautions / Restrictions Precautions Precautions: Fall Precaution Comments: pt started running Vtach with sitting up to EOB and donning socks limiting OOB to chair at this time Restrictions Weight Bearing Restrictions: No   Pertinent Vitals/Pain Pt denies pain but did have freq episodes of vtach with activity. + SOB      Mobility  Bed Mobility Bed Mobility: Supine to Sit;Sit to Supine Supine to Sit: 4: Min guard;HOB elevated (all the way up) Sit to Supine: 4: Min guard;HOB flat Details for Bed Mobility Assistance: pt put HOB all the way up despite v/c's not to. patient reports "It's to hard to get up with it flat." pt able to scoot self up in the bed Transfers Transfers: Not assessed (due to pt with [facility]) Ambulation/Gait Ambulation/Gait Assistance: Not tested (comment)    Exercises     PT Diagnosis: Difficulty walking  PT Problem List: Decreased strength;Decreased activity tolerance;Cardiopulmonary status limiting activity;Decreased mobility PT Treatment  Interventions: DME instruction;Gait training;Functional mobility training;Therapeutic activities;Balance training   PT Goals Acute Rehab PT Goals PT Goal Formulation: With patient Time For Goal Achievement: 07/18/12 Potential to Achieve Goals: Good Pt will go Supine/Side to Sit: with HOB 0 degrees;with supervision PT Goal: Supine/Side to Sit - Progress: Goal set today Pt will go Sit to Stand: with min assist;with upper extremity assist (up to RW) PT Goal: Sit to Stand - Progress: Goal set today Pt will Transfer Bed to Chair/Chair to Bed: with supervision (with RW) PT Transfer Goal: Bed to Chair/Chair to Bed - Progress: Goal set today Pt will Ambulate: 16 - 50 feet;with min assist (with RW) PT Goal: Ambulate - Progress: Goal set today Pt will Go Up / Down Stairs: 3-5 stairs;with min assist (with L HR) PT Goal: Up/Down Stairs - Progress: Goal set today  Visit Information  Last PT Received On: 07/04/12 Assistance Needed: +1    Subjective Data  Subjective: Pt received supine in bed agreeable to PT.   Prior Functioning  Home Living Lives With: Family Available Help at Discharge: Available 24 hours/day Type of Home: Mobile home Home Access: Stairs to enter [employer] of Steps: 4 Entrance Stairs-Rails: Left Home Layout: One level Bathroom Shower/Tub: Engineer, manufacturing systems: Standard Bathroom Accessibility: Yes How Accessible: Accessible via walker Home Adaptive Equipment: Shower chair with back;Walker - rolling Additional Comments: pt desires hospital bed Prior Function Level of Independence: Independent with assistive device(s) Able to Take Stairs?: Yes Driving: No Vocation: Retired Comments: pt reports recently needed assist for bathing due to  breathing and his heart Communication Communication: HOH Dominant Hand: Right    Cognition  Cognition Arousal/Alertness: Awake/alert Behavior During Therapy: WFL for tasks assessed/performed Overall  Cognitive Status: Within Functional Limits for tasks assessed    Extremity/Trunk Assessment Right Upper Extremity Assessment RUE ROM/Strength/Tone: WFL for tasks assessed Left Upper Extremity Assessment LUE ROM/Strength/Tone: WFL for tasks assessed Right Lower Extremity Assessment RLE ROM/Strength/Tone: WFL for tasks assessed Left Lower Extremity Assessment LLE ROM/Strength/Tone: WFL for tasks assessed Trunk Assessment Trunk Assessment: Normal   Balance Balance Balance Assessed: Yes Static Sitting Balance Static Sitting - Balance Support: No upper extremity supported;Feet unsupported Static Sitting - Level of Assistance: 5: Stand by assistance Static Sitting - Comment/# of Minutes: 5 min  End of Session PT - End of Session Equipment Utilized During Treatment: Oxygen Activity Tolerance: Treatment limited secondary to medical complications (Comment) (pt began to go into [facility]) Patient left: in bed;with call bell/phone within reach;with nursing in room Nurse Communication: Mobility status (pt going into [facility] with activity)  GP     Kosisochukwu Goldberg Marie 07/04/2012, 11:59 AM  Lewis Shock, PT, DPT Pager #: [phone] Office #: [phone]

## 2012-07-04 NOTE — Progress Notes (Signed)
ANTICOAGULATION CONSULT NOTE - Follow Up Consult  Pharmacy Consult for coumadin Indication: atrial fibrillation  Allergies  Allergen Reactions  . Penicillins     Patient Measurements: Height: 6' (182.9 cm) Weight: 246 lb 11.1 oz (111.9 kg) IBW/kg (Calculated) : 73.1  Vital Signs: Temp: 97.1 F (36.2 C) (06/18 0748) Temp src: Oral (06/18 0748) BP: 121/69 mmHg (06/18 0928) Pulse Rate: 82 (06/18 0928)  Labs:  Recent Labs  07/02/12 2201 07/03/12 0250 07/03/12 0845 07/03/12 1419 07/04/12 0845  HGB 11.2*  --  10.9*  --  10.5*  HCT 33.3*  --  32.3*  --  31.7*  PLT 245  --  235  --  292  LABPROT  --   --  63.2*  --  51.4*  INR  --   --  8.37*  --  6.33*  CREATININE 0.50  --  0.57  --  0.68  TROPONINI  --  <0.30 <0.30 <0.30  --     Estimated Creatinine Clearance: 111 ml/min (by C-G formula based on Cr of 0.68).   Medications:  Scheduled:  . Chlorhexidine Gluconate Cloth  6 each Topical Q0600  . furosemide  40 mg Intravenous Q8H  . hydrALAZINE  25 mg Oral Q8H  . ipratropium  0.5 mg Nebulization BID  . levalbuterol  0.63 mg Nebulization BID  . levofloxacin (LEVAQUIN) IV  750 mg Intravenous Q24H  . magnesium sulfate 1 - 4 g bolus IVPB  2 g Intravenous Once  . metoCLOPramide  5-10 mg Oral TID AC & HS  . metoprolol succinate  50 mg Oral BID  . mupirocin ointment  1 application Nasal BID  . pantoprazole  40 mg Oral Daily  . potassium chloride SA  40 mEq Oral Daily  . sertraline  50 mg Oral Daily  . sodium chloride  3 mL Intravenous Q12H  . sodium chloride  3 mL Intravenous Q12H  . spironolactone  25 mg Oral Daily  . Warfarin - Pharmacist Dosing Inpatient   Does not apply q1800    Assessment: 72 yo male admitted with SOB noted with h/o Afib to continue Coumadin once INR is back in goal. INR today= 6.33.   Goal of Therapy:  INR 2-3 Monitor platelets by anticoagulation protocol: Yes   Plan:  -Hold coumadin -Daily PT/INR  Harland German, Pharm D 07/04/2012 11:18  AM

## 2012-07-04 NOTE — Progress Notes (Signed)
Revonda Standard N.P notified of Vtach runs with minimal exertion.  Will continue to monitor and evaluate treatment effectiveness.

## 2012-07-05 ENCOUNTER — Inpatient Hospital Stay (HOSPITAL_COMMUNITY): Payer: Medicare Other

## 2012-07-05 DIAGNOSIS — J189 Pneumonia, unspecified organism: Secondary | ICD-10-CM

## 2012-07-05 LAB — MAGNESIUM: Magnesium: 2.1 mg/dL (ref 1.5–2.5)

## 2012-07-05 LAB — CBC
MCH: 31.4 pg (ref 26.0–34.0)
Platelets: 269 10*3/uL (ref 150–400)
RBC: 3.38 MIL/uL — ABNORMAL LOW (ref 4.22–5.81)

## 2012-07-05 LAB — BASIC METABOLIC PANEL
Calcium: 9.3 mg/dL (ref 8.4–10.5)
GFR calc Af Amer: >90 mL/min (ref >90)
GFR calc non Af Amer: >90 mL/min (ref >90)
Glucose, Bld: 119 mg/dL — ABNORMAL HIGH (ref 70–99)
Sodium: 134 mEq/L — ABNORMAL LOW (ref 135–145)

## 2012-07-05 LAB — PROTIME-INR
INR: 3.81 — ABNORMAL HIGH (ref 0.00–1.49)
Prothrombin Time: 35.3 seconds — ABNORMAL HIGH (ref 11.6–15.2)

## 2012-07-05 MED ORDER — METOCLOPRAMIDE HCL 5 MG PO TABS
5.0000 mg | ORAL_TABLET | Freq: Three times a day (TID) | ORAL | Status: DC
  Administered 2012-07-05 – 2012-07-06 (×4): 5 mg via ORAL
  Filled 2012-07-05 (×8): qty 1

## 2012-07-05 NOTE — Progress Notes (Signed)
PULMONARY  / CRITICAL CARE MEDICINE  Name: Billy Casey MRN: [id number] DOB: 21-Oct-1940    ADMISSION DATE:  07/02/2012 CONSULTATION DATE:  07/04/12  REFERRING MD :  Butler Denmark PRIMARY SERVICE:  [facility]  CHIEF COMPLAINT:  Dyspnea, ?ILD  BRIEF PATIENT DESCRIPTION: 72 yo male with hx COPD, CHF, Afib admitted by [facility] with ?PNA +/- CHF.  CT chest with ILD and PCCM consulted.   SIGNIFICANT EVENTS / STUDIES:  CT chest 6/17>>>Tree-in-bud nodularity, right upper lobe predominant, suspicious for pneumonia. Atypical/viral infection is possible. Suspected chronic interstitial lung disease with chronic bronchitis in the bilateral lower lobes. Cardiomegaly. No frank interstitial edema. Mediastinal lymphadenopathy, likely reactive. ECHO 6/17 >>> EF 60-65%, severe left atrial dilatation, right atrial dilatation, PA peak 45 mmHg, RV pressure 45 mm Hg   LINES / TUBES: none  CULTURES: none  ANTIBIOTICS: Levaquin 6/16>>>  HISTORY OF PRESENT ILLNESS:  Patient reports coughing all last night, didn't bring up any sputum.  Still mildly short of breath.  No overt difficulty with solumedrol, did mention seeing somebody dancing.   VITAL SIGNS: Temp:  [97.3 F (36.3 C)-98.3 F (36.8 C)] 98.3 F (36.8 C) (06/19 0737) Pulse Rate:  [64-90] 90 (06/19 0737) Resp:  [18-24] 18 (06/19 0737) BP: (109-138)/(45-83) 133/77 mmHg (06/19 0737) SpO2:  [91 %-96 %] 92 % (06/19 0737)  PHYSICAL EXAMINATION: General:  Pleasant male, NAD Neuro:  Awake, alert, appropriate, MAE HEENT:  Mm dry, no JVD Cardiovascular:  s1s2 irreg Lungs:  resps even non labored on [place], dry crackles esp RLL, no wheeze or rhonchi Abdomen:  Soft, +bs Musculoskeletal:  Scant BLE edema    Recent Labs Lab 07/03/12 0845 07/04/12 0845 07/05/12 0610  NA 130* 132* 134*  K 3.4* 3.4* 3.9  CL 92* 95* 96  CO2 21 27 28   BUN 9 14 15   CREATININE 0.57 0.68 0.63  GLUCOSE 157* 125* 119*    Recent Labs Lab 07/03/12 0845 07/04/12 0845  07/05/12 0610  HGB 10.9* 10.5* 10.6*  HCT 32.3* 31.7* 32.8*  WBC 9.1 9.5 7.9  PLT 235 292 269   Ct Chest W Contrast  07/03/2012   *RADIOLOGY REPORT*  Clinical Data: Shortness of breath, nonproductive cough, chest pain.  Infiltrates.  CT CHEST WITH CONTRAST  Technique:  Multidetector CT imaging of the chest was performed following the standard protocol during bolus administration of intravenous contrast.  Contrast: 80mL OMNIPAQUE IOHEXOL 300 MG/ML  SOLN  Comparison: Chest radiograph dated 07/02/2012  Findings: Tree-in-bud nodularity, right upper lobe predominant (series 3/image 16), suspicious for pneumonia.  Lower lobe bronchiectasis with associated patchy bibasilar opacities, likely reflecting sequela of chronic bronchitis and dependent atelectasis.  Underlying multifocal patchy opacities / fibrosis in the upper lobes, suspicious for chronic interstitial lung disease.  No frank interstitial edema.  No pleural effusion or pneumothorax.  Visualized thyroid is unremarkable.  Cardiomegaly.  No pericardial effusion.  Coronary atherosclerosis. There are calcifications of the aortic arch.  Mediastinal lymphadenopathy, including: --12 mm short-axis prevascular node (series 2/image 23) --13 mm short-axis right paratracheal node (series 2/image 23) --11 mm short-axis AP window node (series 2/image 25) --15 mm short-axis subcarinal node (series 2/image 34)  Visualized upper abdomen is unremarkable.  Degenerative changes of the visualized thoracolumbar spine.  IMPRESSION: Tree-in-bud nodularity, right upper lobe predominant, suspicious for pneumonia.  Atypical/viral infection is possible.  Suspected chronic interstitial lung disease with chronic bronchitis in the bilateral lower lobes.  Cardiomegaly.  No frank interstitial edema.  Mediastinal lymphadenopathy, likely reactive.   Original  Report Authenticated By: Charline Bills, M.D.    ASSESSMENT / PLAN:  Dyspnea/ ILD -- Doubt CAP +/- mild CHF with likely ILD.   CT chest with chronic changes-although not typical but suggestive of IPF.  No obvious environmental factors.  No hx amiodarone, etc. Remote smoking hx. - negative for ANA, ACE, Rheum factor, ESR 75  PLAN -   would like to see old CXR reports from [place] when obtained - biopsy may have to be discussed but low yield procedure, in my opinion Cont O2  Keep dry as tol -FU BNP, should have decreased with neg balance Cont BD although no obvious bronchospasm  Cont solumedrol 40 q 12h but stop if he has any delirium side effects Urine strep antigen negative, WBCs WNL, no fever trend, doubt CAP but will cont levaquin x7ds  outpt f/u -- he is planning to stay in [place] for a while with his daughter but ultimately will go home to [place].    PVCs/ diastolic dysfunction/ Atrial Fibn - will defer to primary for care.  Okay to cont diuresis if K repleted.  Coagulopathy - cont to hold coumadin, Supratheraputic INR  Summary:  Patient has negative autoimmune panel with mild elevation of ESR which is non-specific in this setting.  No obvious causes for ILD are known at this time.  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  Will cont on solumedrol if patient can tolerate without delirium to see if there is any improvement.    Terri Piedra, PA-S 07/05/2012  10:07 AM  Care during the described time interval was provided by me and/or other providers on the critical care team.  I have reviewed this patient's available data, including medical history, events of note, physical examination and test results as part of my evaluation  Marvin Grabill V.

## 2012-07-05 NOTE — Progress Notes (Signed)
CONSULT NOTE - Follow Up Consult  Pharmacy Consult for coumadin, levaquin  Indication: atrial fibrillation  Allergies  Allergen Reactions  . Penicillins     Patient Measurements: Height: 6' (182.9 cm) Weight: 246 lb 11.1 oz (111.9 kg) IBW/kg (Calculated) : 73.1  Vital Signs: Temp: 98.3 F (36.8 C) (06/19 0737) Temp src: Oral (06/19 0737) BP: 133/77 mmHg (06/19 0737) Pulse Rate: 90 (06/19 0737)  Labs:  Recent Labs  07/03/12 0250 07/03/12 0845 07/03/12 1419 07/04/12 0845 07/05/12 0610  HGB  --  10.9*  --  10.5* 10.6*  HCT  --  32.3*  --  31.7* 32.8*  PLT  --  235  --  292 269  LABPROT  --  63.2*  --  51.4* 35.3*  INR  --  8.37*  --  6.33* 3.81*  CREATININE  --  0.57  --  0.68 0.63  TROPONINI <0.30 <0.30 <0.30  --   --     Estimated Creatinine Clearance: 111 ml/min (by C-G formula based on Cr of 0.63).   Medications:  Scheduled:  . Chlorhexidine Gluconate Cloth  6 each Topical Q0600  . furosemide  40 mg Intravenous Q12H  . hydrALAZINE  25 mg Oral Q8H  . ipratropium  0.5 mg Nebulization BID  . levalbuterol  0.63 mg Nebulization BID  . levofloxacin (LEVAQUIN) IV  750 mg Intravenous Q24H  . methylPREDNISolone (SOLU-MEDROL) injection  40 mg Intravenous Q12H  . metoCLOPramide  5-10 mg Oral TID AC & HS  . metoprolol succinate  50 mg Oral Daily  . mupirocin ointment  1 application Nasal BID  . pantoprazole  40 mg Oral Daily  . potassium chloride SA  40 mEq Oral BID  . sertraline  50 mg Oral Daily  . sodium chloride  3 mL Intravenous Q12H  . spironolactone  25 mg Oral Daily  . Warfarin - Pharmacist Dosing Inpatient   Does not apply q1800    Assessment: 72 yo male admitted with SOB noted with h/o Afib to continue Coumadin once INR is back in goal. INR today= 3.81 with trend down. Patient noted on levaquin for PNA will will likely effect coumadin once it is restarted. Noted MD investigating cost of Xarelto for patient.   Goal of Therapy:  INR 2-3 Monitor  platelets by anticoagulation protocol: Yes   Plan:  -Hold coumadin -Daily PT/INR -No levaquin dose changes needed -Consider changing levaquin to po  Harland German, Pharm D 07/05/2012 10:29 AM    Harland German, Pharm D 07/05/2012 10:27 AM

## 2012-07-05 NOTE — Progress Notes (Signed)
Occupational Therapy Evaluation Patient Details Name: Billy Casey MRN: [id number] DOB: 11/17/1940 Today's Date: 07/05/2012 Time: 4098-1191 OT Time Calculation (min): 25 min  OT Assessment / Plan / Recommendation Clinical Impression  72 yo male admitted for SOB. Hx of COPD. PTA, pt independent with ADL and mobility. Pt states that he has had difficulty with dizziness for the past 3 months and that the room feels as if it is spinning when these episodes occur. Pt states that it most often happens when he changes positions and that he has fallen due to the dizziness.  ? BPVV. Will ask for vestibular consult. Pt will benefit from skilled OT services to facilitate D/C to home due to below deficits.    OT Assessment  Patient needs continued OT Services    Follow Up Recommendations  No OT follow up    Barriers to Discharge None    Equipment Recommendations  None recommended by OT    Recommendations for Other Services  Vestibular eval  Frequency  Min 2X/week    Precautions / Restrictions Precautions Precautions: Fall Restrictions Weight Bearing Restrictions: No   Pertinent Vitals/Pain O2 SATS decline to 92 with minimal activity. Dyspnea 2/4    ADL  Grooming: Supervision/safety Where Assessed - Grooming: Unsupported standing Upper Body Bathing: Supervision/safety;Set up Where Assessed - Upper Body Bathing: Unsupported sitting Lower Body Bathing: Set up;Supervision/safety Where Assessed - Lower Body Bathing: Supported sit to stand Upper Body Dressing: Supervision/safety;Set up Where Assessed - Upper Body Dressing: Unsupported sitting Lower Body Dressing: Set up;Supervision/safety Where Assessed - Lower Body Dressing: Supported sit to stand Toilet Transfer: Min Pension scheme manager Method: Other (comment) (ambulating) Acupuncturist: Bedside commode Equipment Used: Gait belt Transfers/Ambulation Related to ADLs: min guard. Pt unsteady and required min A. Pt c/o  dizziness ADL Comments: limited by poor endurance and dizziness at times    OT Diagnosis: Generalized weakness  OT Problem List: Decreased activity tolerance;Decreased knowledge of use of DME or AE;Cardiopulmonary status limiting activity OT Treatment Interventions: Self-care/ADL training;Therapeutic exercise;Energy conservation;DME and/or AE instruction;Therapeutic activities;Patient/family education   OT Goals Acute Rehab OT Goals OT Goal Formulation: With patient Time For Goal Achievement: 07/19/12 Potential to Achieve Goals: Good ADL Goals Pt Will Transfer to Toilet: with modified independence;with DME;Ambulation;3-in-1 ADL Goal: Toilet Transfer - Progress: Goal set today Additional ADL Goal #1: Pt will demonstrate 2 E conservatio techniques during ADL. ADL Goal: Additional Goal #1 - Progress: Goal set today Additional ADL Goal #2: O2 SATS will remian >90 during funcitonal activity with rest breaks as needed on RA. ADL Goal: Additional Goal #2 - Progress: Goal set today  Visit Information  Last OT Received On: 07/05/12 Assistance Needed: +1    Subjective Data      Prior Functioning     Home Living Lives With: Family Available Help at Discharge: Available 24 hours/day Type of Home: Mobile home Home Access: Stairs to enter [employer] of Steps: 4 Entrance Stairs-Rails: Left Home Layout: One level Bathroom Shower/Tub: Engineer, manufacturing systems: Standard Bathroom Accessibility: Yes How Accessible: Accessible via walker Home Adaptive Equipment: Shower chair with back;Walker - rolling Additional Comments: pt desires hospital bed Prior Function Level of Independence: Independent with assistive device(s) Able to Take Stairs?: Yes Driving: No Vocation: Retired Musician: HOH Dominant Hand: Right         Vision/Perception Vision - History Baseline Vision: Wears glasses all the time   Cognition  Cognition Arousal/Alertness:  Awake/alert Behavior During Therapy: WFL for tasks assessed/performed Overall  Cognitive Status: Within Functional Limits for tasks assessed    Extremity/Trunk Assessment Right Upper Extremity Assessment RUE ROM/Strength/Tone: [name] for tasks assessed Left Upper Extremity Assessment LUE ROM/Strength/Tone: WFL for tasks assessed Right Lower Extremity Assessment RLE ROM/Strength/Tone: [name] for tasks assessed Left Lower Extremity Assessment LLE ROM/Strength/Tone: [name] for tasks assessed Trunk Assessment Trunk Assessment: Normal     Mobility Bed Mobility Bed Mobility: Supine to Sit;Sit to Supine Supine to Sit: HOB elevated;5: Supervision (all the way up) Sit to Supine: HOB flat;5: Supervision Details for Bed Mobility Assistance: pt put HOB all the way up despite v/c's not to. patient reports "It's to hard to get up with it flat." pt able to scoot self up in the bed Transfers Transfers: Sit to Stand;Stand to Sit Sit to Stand: 4: Min assist Stand to Sit: 4: Min assist Details for Transfer Assistance: SOB during transfer     Exercise     Balance Balance Balance Assessed: Yes Static Sitting Balance Static Sitting - Balance Support: No upper extremity supported;Feet unsupported Static Sitting - Level of Assistance: 5: Stand by assistance   End of Session OT - End of Session Equipment Utilized During Treatment: Gait belt Activity Tolerance: Patient limited by fatigue Patient left: in chair;with call bell/phone within reach Nurse Communication: Mobility status  GO     Lessly Stigler,HILLARY 07/05/2012, 2:11 PM [facility], OTR/L  [phone] 07/05/2012

## 2012-07-05 NOTE — Progress Notes (Signed)
[facility] Progress Note [place] TEAM 1 - Stepdown/ICU TEAM   Billy Casey EAV:[id number] DOB: 04/24/1940 DOA: 07/02/2012 PCP: No primary provider on file.  Brief narrative: 72 year old male patient from [place], [place] with multiple medical problems including chronic atrial fibrillation, COPD and hypertension. Patient endorsed to the admitting physician SOB for 3 days which had been progressively worsening. Also associated with chest pressure that spontaneously resolved. Unfortunately the chest pressure recurred and because of the shortness of breath the patient presented to the ER for treatment.   In the ER he was found to be acutely short of breath and hypoxic and placed on BiPAP. Chest x-ray was consistent with either multifocal pneumonia or interstitial lung disease and a question of CHF. Patient was also found to have rapid ventricular response with his underlying atrial fibrillation. He was given a dose of IV Lasix in the emergency department and started on Levaquin for pneumonia. At the time the admitting physician arrived to evaluate the patient he had improved significantly and was able to be weaned off of BiPAP.  Assessment/Plan:  Acute respiratory failure with hypoxia due to:   A) RUL PNA   B) CHF- Diastolic dysfunction?   C)  ILD - cough present x > 1 month with productive yellow sputum and also recent hospitalization in [place] ~5 weeks ago -cont Levaquin - obtain sputum culture -also h/o of CHF- ECHO reveals normal EF and no diastolic function- given Lasix 40 q 8hrs and Aldactone (was on Bumex and Aldactone at home) and is in negative balance by 4 L- diuretics held today to prevent dehydration -Ct suggestive of ILD- due to possibility of a pneumonitis, started on steroids- autoimmune w/u in progress - f/u on CXR today - no longer requiring O2 but dyspneic with exertion  Atrial fibrillation with RVR  -rate better controlled with correction of  hypoxia  Warfarin-induced coagulopathy -INR 8.3  (6/17) so held Coumadin-  -No signs of active bleeding -Pharmacy managing - Xarelto not covered by his insurance and Pradaxa/ Eliquis too expesive for him ( about $70/ month) Therefore will cont Coumadin  HTN (hypertension) -BP controlled  Hyponatremia -improvement in Na+ after Lasix so will follow  NSVT/Hypokalemia/Hypomagnesemia Replaced -Mg 1.7 so IV repleted  - f/u on K in AM - will d/c K replacement as we are stopping Lasix.   Chest pain, atypical/chronic -more c/w costochondritis- cont Kpad -enzymes negative and ECHO without RWMA -EKG 's have been non ischemic  ? Dementia/depression -apparent progressive memory loss that has worsened somewhat with acute illness -follow -dtr says PCP started Zoloft this past Monday   DVT prophylaxis: Coumadin was supratherapeutic INR Code Status: Full Family Communication: Patient  Disposition Plan: transfer to telemetry Isolation: Contact isolation for MRSA PCR positive status  Consultants: Pulmonary medicine  Procedures: 2-D echocardiogram  - Left ventricle: The cavity size was normal. Wall thickness was normal. Systolic function was normal. The estimated ejection fraction was in the range of 60% to 65%. The study is not technically sufficient to allow evaluation of LV diastolic function. - Left atrium: The atrium was severely dilated. - Right ventricle: The cavity size was mildly dilated. - Right atrium: The atrium was severely dilated. - Pulmonary arteries: Systolic pressure was mildly to moderately increased. PA peak pressure: 45mm Hg (S).   Antibiotics: Levaquin 6/17 >>>  HPI/Subjective: Patient still with dyspnea on exertion and severe dry cough.    Objective: Blood pressure 162/78, pulse 89, temperature 98.1 F (36.7 C), temperature source  Oral, resp. rate 21, height 6' (1.829 m), weight 111.9 kg (246 lb 11.1 oz), SpO2 94.00%.  Intake/Output Summary (Last 24  hours) at 07/05/12 1329 Last data filed at 07/05/12 1045  Gross per 24 hour  Intake      3 ml  Output   1700 ml  Net  -1697 ml     Exam: General: No acute respiratory distress while at rest Lungs: Coarse to auscultation bilaterally with basilar fine crackles, 2L- no wheeze Cardiovascular: Irregular rhythm without murmur gallop or rub normal S1 and S2, no peripheral edema or JVD, frequent PVC's occ multifocal and has had several short bursts of NSVT Abdomen: Nontender, nondistended, soft, bowel sounds positive, no rebound, no ascites, no appreciable mass Musculoskeletal: No significant cyanosis, clubbing of bilateral lower extremities Neurological: Alert and oriented x 3, moves all extremities x 4 without focal neurological deficits, CN 2-12 intact-?? HOH  Scheduled Meds: Scheduled Meds: . Chlorhexidine Gluconate Cloth  6 each Topical Q0600  . furosemide  40 mg Intravenous Q12H  . hydrALAZINE  25 mg Oral Q8H  . ipratropium  0.5 mg Nebulization BID  . levalbuterol  0.63 mg Nebulization BID  . levofloxacin (LEVAQUIN) IV  750 mg Intravenous Q24H  . methylPREDNISolone (SOLU-MEDROL) injection  40 mg Intravenous Q12H  . metoCLOPramide  5 mg Oral TID AC & HS  . metoprolol succinate  50 mg Oral Daily  . mupirocin ointment  1 application Nasal BID  . pantoprazole  40 mg Oral Daily  . potassium chloride SA  40 mEq Oral BID  . sertraline  50 mg Oral Daily  . sodium chloride  3 mL Intravenous Q12H  . spironolactone  25 mg Oral Daily  . Warfarin - Pharmacist Dosing Inpatient   Does not apply q1800   Data Reviewed: Basic Metabolic Panel:  Recent Labs Lab 07/02/12 2201 07/03/12 0845 07/04/12 0845 07/05/12 0610  NA 129* 130* 132* 134*  K 3.2* 3.4* 3.4* 3.9  CL 93* 92* 95* 96  CO2 23 21 27 28   GLUCOSE 106* 157* 125* 119*  BUN 9 9 14 15   CREATININE 0.50 0.57 0.68 0.63  CALCIUM 9.1 8.9 9.0 9.3  MG  --  1.7  --  2.1  PHOS  --  2.9  --   --    Liver Function Tests:  Recent  Labs Lab 07/02/12 2201 07/03/12 0845 07/04/12 0845  AST 32 36 39*  ALT 11 13 17   ALKPHOS 104 120* 155*  BILITOT 1.3* 1.1 0.6  PROT 6.5 6.5 6.2  ALBUMIN 2.9* 2.8* 2.8*   CBC:  Recent Labs Lab 07/02/12 2201 07/03/12 0845 07/04/12 0845 07/05/12 0610  WBC 10.3 9.1 9.5 7.9  NEUTROABS 8.6* 8.2*  --   --   HGB 11.2* 10.9* 10.5* 10.6*  HCT 33.3* 32.3* 31.7* 32.8*  MCV 96.0 95.8 96.9 97.0  PLT 245 235 292 269   Cardiac Enzymes:  Recent Labs Lab 07/03/12 0250 07/03/12 0845 07/03/12 1419  TROPONINI <0.30 <0.30 <0.30   BNP (last 3 results)  Recent Labs  07/02/12 2201  PROBNP 3078.0*     Recent Results (from the past 240 hour(s))  MRSA PCR SCREENING     Status: Abnormal   Collection Time    07/03/12  1:56 AM      Result Value Range Status   MRSA by PCR POSITIVE (*) NEGATIVE Final   Comment:            The GeneXpert MRSA Assay (FDA  approved for NASAL specimens     only), is one component of a     comprehensive MRSA colonization     surveillance program. It is not     intended to diagnose MRSA     infection nor to guide or     monitor treatment for     MRSA infections.     RESULT CALLED TO, READ BACK BY AND VERIFIED WITH:     C.REID,RN 1610 07/03/12 M.CAMPBELL     Studies:  Recent x-ray studies have been reviewed in detail by the Attending Physician   Calvert Cantor, MD [facility] Office  [phone] Pager [phone]   On-Call/Text Page:      [email or web address]      password [facility]  07/05/2012, 1:29 PM   LOS: 3 days

## 2012-07-05 NOTE — Plan of Care (Signed)
Problem: Phase I Progression Outcomes Goal: OOB as tolerated unless otherwise ordered Outcome: Progressing Ambulating in room and in hall with PT

## 2012-07-06 LAB — PROTIME-INR: Prothrombin Time: 38 seconds — ABNORMAL HIGH (ref 11.6–15.2)

## 2012-07-06 LAB — GLUCOSE, CAPILLARY

## 2012-07-06 MED ORDER — PREDNISONE 50 MG PO TABS
60.0000 mg | ORAL_TABLET | Freq: Every day | ORAL | Status: DC
  Administered 2012-07-07: 60 mg via ORAL
  Filled 2012-07-06 (×2): qty 1

## 2012-07-06 MED ORDER — LEVOFLOXACIN 500 MG PO TABS
500.0000 mg | ORAL_TABLET | Freq: Every day | ORAL | Status: DC
  Administered 2012-07-06 – 2012-07-07 (×2): 500 mg via ORAL
  Filled 2012-07-06 (×2): qty 1

## 2012-07-06 NOTE — Progress Notes (Signed)
Billy Casey, [place] [phone] 07/06/2012

## 2012-07-06 NOTE — Progress Notes (Signed)
Pt arrived to unit via 2 NTs. Pt is a&o, in NAD. Family at bedside. Pt placed on telemetry box 6702. Will continue to monitor.

## 2012-07-06 NOTE — Progress Notes (Signed)
Physical Therapy Treatment Patient Details Name: Billy Casey MRN: [id number] DOB: 03/06/1940 Today's Date: 07/06/2012 Time: 7829-5621 PT Time Calculation (min): 15 min  PT Assessment / Plan / Recommendation Comments on Treatment Session  Patient continues to be limited by fatigue secondary to condition. SOB x 2 requiring a rest break during ambulation. He is impulsive with his movements and tends to move in a reckless manner at times requiring cues for increased safety awareness. Pt requires cueing to slow down and move in a controlled manner for safety and balance.      Follow Up Recommendations  Supervision/Assistance - 24 hour;Home health PT     Does the patient have the potential to tolerate intense rehabilitation     Barriers to Discharge        Equipment Recommendations  None recommended by PT    Recommendations for Other Services    Frequency Min 3X/week   Plan      Precautions / Restrictions Precautions Precautions: Fall Restrictions Weight Bearing Restrictions: No   Pertinent Vitals/Pain Patient reported that his whole body was in discomfort.     Mobility  Bed Mobility Bed Mobility: Supine to Sit;Sit to Supine Supine to Sit: 5: Supervision;HOB flat Sit to Supine: 5: Supervision;HOB flat Details for Bed Mobility Assistance: Pt able to move well with HOB flat. At the end of the session pt. raised the [facility] to an appropriate level for comfort. Able to perform independently but required cues for increased awareness of hips in proximity to EOB (moves quickly)   Transfers Transfers: Sit to Stand;Stand to Sit Sit to Stand: 5: Supervision;With upper extremity assist;With armrests;From bed;From chair/3-in-1 Stand to Sit: 5: Supervision;With upper extremity assist;With armrests;To bed;To chair/3-in-1 Details for Transfer Assistance: Pt was impulsive when transfering from bed/chair to standing. Pt. required VC to slow down and move in a controlled manner. Pt was able to  demonstrate proper technique when transfering after being reminded once.  Ambulation/Gait Ambulation/Gait Assistance: 4: Min guard Ambulation Distance (Feet): 180 Feet Assistive device: Rolling walker Ambulation/Gait Assistance Details: Pt needed VC to slow his gait to a safe and controlled speed. He required two rest breaks for ambulating 180' due to SOB and fatigue. Gait Pattern: Step-through pattern;Trunk flexed    Exercises General Exercises - Lower Extremity Ankle Circles/Pumps: AROM;Both;10 reps;Supine    PT Goals Acute Rehab PT Goals Time For Goal Achievement: 07/18/12 Potential to Achieve Goals: Good Pt will go Supine/Side to Sit: with HOB 0 degrees;with supervision PT Goal: Supine/Side to Sit - Progress: Met Pt will go Sit to Stand: with min assist;with upper extremity assist PT Goal: Sit to Stand - Progress: Met Pt will Transfer Bed to Chair/Chair to Bed: with supervision Pt will Ambulate: 16 - 50 feet;with min assist PT Goal: Ambulate - Progress: Met Pt will Go Up / Down Stairs: 3-5 stairs;with min assist  Visit Information  Last PT Received On: 07/06/12 Assistance Needed: +1    Subjective Data  Subjective: Pt received supine in bed and agreeable to PT. Pt state that he did not feel well and that his whole body hurt.   Cognition  Cognition Arousal/Alertness: Awake/alert Behavior During Therapy: WFL for tasks assessed/performed Overall Cognitive Status: Within Functional Limits for tasks assessed    Balance     End of Session PT - End of Session Equipment Utilized During Treatment: Gait belt Activity Tolerance: Patient limited by fatigue Patient left: in bed;with call bell/phone within reach   GP     Jolyn Nap, SPTA  07/06/2012, 2:22 PM

## 2012-07-06 NOTE — Progress Notes (Signed)
PULMONARY  / CRITICAL CARE MEDICINE  Name: Billy Casey MRN: [id number] DOB: 08/27/1940    ADMISSION DATE:  07/02/2012 CONSULTATION DATE:  07/04/12  REFERRING MD :  Butler Denmark PRIMARY SERVICE:  [facility]  CHIEF COMPLAINT:  Dyspnea, ?ILD  BRIEF PATIENT DESCRIPTION: 72 yo male with hx COPD, CHF, Afib admitted by [facility] with ?PNA +/- CHF.  CT chest with ILD and PCCM consulted.   SIGNIFICANT EVENTS / STUDIES:  CT chest 6/17>>>Tree-in-bud nodularity, right upper lobe predominant, suspicious for pneumonia. Atypical/viral infection is possible. Suspected chronic interstitial lung disease with chronic bronchitis in the bilateral lower lobes. Cardiomegaly. No frank interstitial edema. Mediastinal lymphadenopathy, likely reactive. ECHO 6/17 >>> EF 60-65%, severe left atrial dilatation, right atrial dilatation, PA peak 45 mmHg, RV pressure 45 mm Hg   LINES / TUBES: none  CULTURES: none  ANTIBIOTICS: Levaquin 6/16>>>  SUBJ: C/o general malaise. No dyspnea or overt distress.    VITAL SIGNS: Temp:  [97.6 F (36.4 C)-98.6 F (37 C)] 97.6 F (36.4 C) (06/20 0746) Pulse Rate:  [71-101] 82 (06/20 0746) Resp:  [19-23] 19 (06/20 0746) BP: (118-167)/(63-82) 118/63 mmHg (06/20 0746) SpO2:  [9 %-96 %] 93 % (06/20 0746)  PHYSICAL EXAMINATION: General: NAD Neuro:  Awake, alert, appropriate, MAE HEENT:  WNL Cardiovascular:  IRIR, no M Lungs:  Bilateral crackles, no M Abdomen:  Soft, +bs Musculoskeletal:  no edema    Recent Labs Lab 07/03/12 0845 07/04/12 0845 07/05/12 0610  NA 130* 132* 134*  K 3.4* 3.4* 3.9  CL 92* 95* 96  CO2 21 27 28   BUN 9 14 15   CREATININE 0.57 0.68 0.63  GLUCOSE 157* 125* 119*    Recent Labs Lab 07/03/12 0845 07/04/12 0845 07/05/12 0610  HGB 10.9* 10.5* 10.6*  HCT 32.3* 31.7* 32.8*  WBC 9.1 9.5 7.9  PLT 235 292 269   Dg Chest 2 View  07/05/2012   *RADIOLOGY REPORT*  Clinical Data: Chest pain for 1 month with shortness of breath weakness and  hypertension  CHEST - 2 VIEW  Comparison: 07/02/2012 and CT 07/03/2012  Findings: Cardiomegaly is identified and appears stable in degree. Aortic calcification is again noted.  The lung fields demonstrate a coarse bilateral interstitial pattern which is unchanged given the slightly improved lung volumes seen today.  No new focal infiltrates or signs of pleural fluid are seen.  No evidence for congestive failure is noted.  Indwelling left subclavian pacer is stable  IMPRESSION: Unchanged cardiomegaly and diffuse coarse interstitial infiltrates. No new findings identified   Original Report Authenticated By: Rhodia Albright, M.D.    ASSESSMENT:  Dyspnea/ ILD - Underlying undefined fibrosis with superimposed something - edema vs PNA vs bchts flare Malaise and weakness Progressive dementia CAF   RECS: Change abx to PO and complete 10 days total Change steroids to PO and taper to off over 10 days Assess home O2 needs Consider D/C of metoclopramide as a contributor to his general malaise (in absence of clear cut indication) Follow up has been arranged as oupt with Dr Vassie Loll - 7/18 @ 2:45 with CXR on same day (already ordered)   PCCM will sign off. Please call if we can be of further assistance  Billy Fischer, MD ; [facility] [phone].  After 5:30 PM or weekends, call [phone]

## 2012-07-06 NOTE — Care Management Note (Signed)
   CARE MANAGEMENT NOTE 07/06/2012  Patient:  SACRAMENTO, MONDS   Account Number:  [id number]  Date Initiated:  07/05/2012  Documentation initiated by:  MAYO,HENRIETTA  Subjective/Objective Assessment:   72yr-old male adm with dx of resp failure; lives with dtr and son-in-law, has rolling walker, shower chair, and BSC     Action/Plan:   Anticipated DC Date:     Anticipated DC Plan:        DC Planning Services  CM consult  Medication Assistance      [facility] Choice  HOME HEALTH   Choice offered to / List presented to:  C-4 Adult Children        HH arranged  HH-1 RN  HH-2 PT      [facility] agency  [facility]   Status of service:   Medicare Important Message given?   (If response is "NO", the following Medicare IM given date fields will be blank) Date Medicare IM given:   Date Additional Medicare IM given:    Discharge Disposition:    Per UR Regulation:  Reviewed for med. necessity/level of care/duration of stay  If discussed at Long Length of Stay Meetings, dates discussed:    Comments:  PCP: Dr Synetta Fail in [place], [place]  #[phone]  07/06/12 0846 Verdis Prime RN MSN BSN CCM TC to son-in-law, he and dtr have been taking pt to [facility] on a monthly basis to see Dr Blanca Friend and will continue to do so.  Discussed home health services to be arranged, he will have dtr call with name of home health agency. 0930 Per dtr, [facility] was providing services and she would prefer for them to continue.  Referral called to [facility], RN will visit 6/23 to draw PT/INR.  CM will fax d/c summary to Dr Blanca Friend @ [phone].  Pt's O2 sats will be checked to determine need for home O2.i  07/05/12 0820 [facility] RN MSN BSN CCM Pt to d/c on Xarelto, TC to dtr to obtain Part D Medicare Plan information. 0900 Pt's plan does not cover Xarelto - rep states pt can get a total 34-day supply of Xarelto during a one-year period.  Pradaxa and Eliquis are covered, copay would be $68  per month for either.  There would be no copay for Coumadin. 1100 Pt requests to resume Coumadin, states PCP in [place] has been adjusting dosage and he wants to continue with that PCP.  Per pt, he was receiving home health nursing services but cannot recall the name of the agency - thinks dtr would know.  TC to dtr, VM message left requesting return call.

## 2012-07-06 NOTE — Progress Notes (Signed)
CONSULT NOTE - Follow Up Consult  Pharmacy Consult for coumadin, levaquin  Indication: atrial fibrillation  Allergies  Allergen Reactions  . Penicillins     Patient Measurements: Height: 6' (182.9 cm) Weight: 246 lb 11.1 oz (111.9 kg) IBW/kg (Calculated) : 73.1  Vital Signs: Temp: 97.6 F (36.4 C) (06/20 0746) Temp src: Oral (06/20 0746) BP: 118/63 mmHg (06/20 0746) Pulse Rate: 82 (06/20 0746)  Labs:  Recent Labs  07/03/12 1419 07/04/12 0845 07/05/12 0610 07/06/12 0405  HGB  --  10.5* 10.6*  --   HCT  --  31.7* 32.8*  --   PLT  --  292 269  --   LABPROT  --  51.4* 35.3* 38.0*  INR  --  6.33* 3.81* 4.21*  CREATININE  --  0.68 0.63  --   TROPONINI <0.30  --   --   --     Estimated Creatinine Clearance: 111 ml/min (by C-G formula based on Cr of 0.63).   Medications:  Scheduled:  . Chlorhexidine Gluconate Cloth  6 each Topical Q0600  . hydrALAZINE  25 mg Oral Q8H  . ipratropium  0.5 mg Nebulization BID  . levalbuterol  0.63 mg Nebulization BID  . levofloxacin  500 mg Oral Daily  . metoprolol succinate  50 mg Oral Daily  . mupirocin ointment  1 application Nasal BID  . pantoprazole  40 mg Oral Daily  . potassium chloride SA  40 mEq Oral BID  . [START ON 07/07/2012] predniSONE  60 mg Oral Q breakfast  . sertraline  50 mg Oral Daily  . sodium chloride  3 mL Intravenous Q12H  . Warfarin - Pharmacist Dosing Inpatient   Does not apply q1800    Assessment: 72 yo male admitted with SOB noted with h/o Afib to continue Coumadin once INR is back in goal. INR today=4.21 with trend up since 6/19. Patient noted on levaquin for PNA will will likely effect coumadin once it is restarted.   Goal of Therapy:  INR 2-3 Monitor platelets by anticoagulation protocol: Yes   Plan:  -Hold coumadin -Daily PT/INR  Harland German, Pharm D 07/06/2012 10:06 AM

## 2012-07-06 NOTE — Progress Notes (Signed)
Nurse called report to Suzie on 6700.  Pt was alert and oriented prior to transport.  Pt belongings were carried with pt..  Pt family was contacted to inform of move.

## 2012-07-06 NOTE — Progress Notes (Signed)
SpO2 at rest on room air 95% SpO2 while ambulating on room air 92%

## 2012-07-06 NOTE — Progress Notes (Signed)
While ambulating pt, pt is very impulsive, did not obey commands. Per pt recently had a fall at home with bruising noted to left lower abd (unclear if pt is a good historian, no family present). Pt running into dinamap with walker, only able to tolerate ambulating from bed to door and back to bed. Placed on bed alarm. Will continue to monitor.

## 2012-07-06 NOTE — Progress Notes (Signed)
[facility] Progress Note [place] TEAM 1 - Stepdown/ICU TEAM   Billy Casey:[id number] DOB: 1940/02/10 DOA: 07/02/2012 PCP: No primary provider on file.  Brief narrative: 72 year old male patient from [place], [place] with multiple medical problems including chronic atrial fibrillation, COPD and hypertension. Patient endorsed to the admitting physician SOB for 3 days which had been progressively worsening. Also associated with chest pressure that spontaneously resolved. Unfortunately the chest pressure recurred and because of the shortness of breath the patient presented to the ER for treatment.   In the ER he was found to be acutely short of breath and hypoxic and placed on BiPAP. Chest x-ray was consistent with either multifocal pneumonia or interstitial lung disease and a question of CHF. Patient was also found to have rapid ventricular response with his underlying atrial fibrillation. He was given a dose of IV Lasix in the emergency department and started on Levaquin for pneumonia. At the time the admitting physician arrived to evaluate the patient he had improved significantly and was able to be weaned off of BiPAP.  Assessment/Plan:  Acute respiratory failure with hypoxia due to:   A) RUL PNA   B) CHF- Diastolic dysfunction?   C)  ILD - cough present x > 1 month with productive yellow sputum and also recent hospitalization in [place] ~5 weeks ago -Levaquin initiated at time of admit- unable to obtain sputum culture since cough remained non productive while here -also h/o of "CHF"- ECHO revealed normal EF and no diastolic dysfunction (see below) - Lasix 40 q 8hrs and Aldactone (was on Bumex and Aldactone at home) with resultant negative balance of 4 L - diuretics dc'd 6/20 since no convincing clinical evidence of ongoing CHF and pt actually euvolemic -plan is to DC Aldactone after dc and monitor weight and give preadmit Bumex based on weight gain -CT suggestive  of ILD- due to possibility of a pneumonitis, started on steroids- autoimmune w/u unrevealing-Pulmonary rec taper/dc steroids and complete pneumonia treatment both over 10 days -felt Reglan contributing to persistent malaise so consider dc -will check sats with ambulation in event needs home oxygen since persistent DOE -Pulmonary surmises may have diastolic dysfunction based on left atrial dialtation  Atrial fibrillation with RVR  -rate better controlled with correction of hypoxia  Warfarin-induced coagulopathy -INR 8.3  (6/17) so held Coumadin- INR has not yet decreased to ~ 2.0 -No signs of active bleeding -Pharmacy managing - Xarelto not covered by his insurance and Pradaxa/ Eliquis too expesive for him ( about $70/ month) Therefore will cont Coumadin-will ask for follow up labs Monday after dc  HTN (hypertension) -BP controlled  Hyponatremia -improvement in Na+ after Lasix so will follow  NSVT/Hypokalemia/Hypomagnesemia -Mg 1.7 so IV repleted  -K improved after repletion and regular dosing stopped with dc of Lasix  Chest pain, atypical/chronic -more c/w costochondritis -enzymes negative and ECHO without RWMA -EKG 's have been non ischemic  ? Dementia/depression -apparent progressive memory loss that has worsened somewhat with acute illness -follow -dtr says PCP started Zoloft the Monday prior to admit   DVT prophylaxis: Coumadin was supratherapeutic INR Code Status: Full Family Communication: Patient  Disposition Plan: transfer to telemetry but keep on Team 1 for dc 6/21 Isolation: Contact isolation for MRSA PCR positive status  Consultants: Pulmonary medicine  Procedures: 2-D echocardiogram  - Left ventricle: The cavity size was normal. Wall thickness was normal. Systolic function was normal. The estimated ejection fraction was in the range of 60% to 65%. The  study is not technically sufficient to allow evaluation of LV diastolic function. - Left atrium: The  atrium was severely dilated. - Right ventricle: The cavity size was mildly dilated. - Right atrium: The atrium was severely dilated. - Pulmonary arteries: Systolic pressure was mildly to moderately increased. PA peak pressure: 45mm Hg (S).   Antibiotics: Levaquin 6/17 >>>  HPI/Subjective: Patient still with dyspnea on exertion and severe dry cough.    Objective: Blood pressure 128/60, pulse 100, temperature 97.6 F (36.4 C), temperature source Oral, resp. rate 19, height 6' (1.829 m), weight 111.9 kg (246 lb 11.1 oz), SpO2 93.00%.  Intake/Output Summary (Last 24 hours) at 07/06/12 1047 Last data filed at 07/06/12 1044  Gross per 24 hour  Intake    153 ml  Output   3025 ml  Net  -2872 ml     Exam: General: No acute respiratory distress while at rest Lungs: Coarse to auscultation bilaterally with basilar fine crackles, 2L- no wheeze Cardiovascular: Irregular rhythm without murmur gallop or rub normal S1 and S2, no peripheral edema or JVD, frequent PVC's occ multifocal and has had several short bursts of NSVT Abdomen: Nontender, nondistended, soft, bowel sounds positive, no rebound, no ascites, no appreciable mass Musculoskeletal: No significant cyanosis, clubbing of bilateral lower extremities Neurological: Alert and oriented x 3, moves all extremities x 4 without focal neurological deficits, CN 2-12 intact-?? HOH  Scheduled Meds: Scheduled Meds: . Chlorhexidine Gluconate Cloth  6 each Topical Q0600  . hydrALAZINE  25 mg Oral Q8H  . ipratropium  0.5 mg Nebulization BID  . levalbuterol  0.63 mg Nebulization BID  . levofloxacin  500 mg Oral Daily  . metoprolol succinate  50 mg Oral Daily  . mupirocin ointment  1 application Nasal BID  . pantoprazole  40 mg Oral Daily  . potassium chloride SA  40 mEq Oral BID  . [START ON 07/07/2012] predniSONE  60 mg Oral Q breakfast  . sertraline  50 mg Oral Daily  . sodium chloride  3 mL Intravenous Q12H  . Warfarin - Pharmacist Dosing  Inpatient   Does not apply q1800   Data Reviewed: Basic Metabolic Panel:  Recent Labs Lab 07/02/12 2201 07/03/12 0845 07/04/12 0845 07/05/12 0610  NA 129* 130* 132* 134*  K 3.2* 3.4* 3.4* 3.9  CL 93* 92* 95* 96  CO2 23 21 27 28   GLUCOSE 106* 157* 125* 119*  BUN 9 9 14 15   CREATININE 0.50 0.57 0.68 0.63  CALCIUM 9.1 8.9 9.0 9.3  MG  --  1.7  --  2.1  PHOS  --  2.9  --   --    Liver Function Tests:  Recent Labs Lab 07/02/12 2201 07/03/12 0845 07/04/12 0845  AST 32 36 39*  ALT 11 13 17   ALKPHOS 104 120* 155*  BILITOT 1.3* 1.1 0.6  PROT 6.5 6.5 6.2  ALBUMIN 2.9* 2.8* 2.8*   CBC:  Recent Labs Lab 07/02/12 2201 07/03/12 0845 07/04/12 0845 07/05/12 0610  WBC 10.3 9.1 9.5 7.9  NEUTROABS 8.6* 8.2*  --   --   HGB 11.2* 10.9* 10.5* 10.6*  HCT 33.3* 32.3* 31.7* 32.8*  MCV 96.0 95.8 96.9 97.0  PLT 245 235 292 269   Cardiac Enzymes:  Recent Labs Lab 07/03/12 0250 07/03/12 0845 07/03/12 1419  TROPONINI <0.30 <0.30 <0.30   BNP (last 3 results)  Recent Labs  07/02/12 2201  PROBNP 3078.0*     Recent Results (from the past 240 hour(s))  MRSA  PCR SCREENING     Status: Abnormal   Collection Time    07/03/12  1:56 AM      Result Value Range Status   MRSA by PCR POSITIVE (*) NEGATIVE Final   Comment:            The GeneXpert MRSA Assay (FDA     approved for NASAL specimens     only), is one component of a     comprehensive MRSA colonization     surveillance program. It is not     intended to diagnose MRSA     infection nor to guide or     monitor treatment for     MRSA infections.     RESULT CALLED TO, READ BACK BY AND VERIFIED WITH:     C.REID,RN [id number] 07/03/12 M.CAMPBELL     Studies:  Recent x-ray studies have been reviewed in detail by the Attending Physician   Junious Silk, ANP [facility] Office  [phone] Pager [phone]   On-Call/Text Page:      [email or web address]      password [facility]  07/06/2012, 10:47 AM   LOS: 4 days     I have examined the patient, reviewed the chart and modified the above note which I agree with.   Emmajane Altamura,MD [phone] 07/06/2012, 3:10 PM

## 2012-07-07 DIAGNOSIS — J8410 Pulmonary fibrosis, unspecified: Secondary | ICD-10-CM

## 2012-07-07 DIAGNOSIS — J189 Pneumonia, unspecified organism: Secondary | ICD-10-CM

## 2012-07-07 DIAGNOSIS — D689 Coagulation defect, unspecified: Secondary | ICD-10-CM

## 2012-07-07 LAB — PROTIME-INR: Prothrombin Time: 37.4 seconds — ABNORMAL HIGH (ref 11.6–15.2)

## 2012-07-07 MED ORDER — LEVOFLOXACIN 500 MG PO TABS: 500.0000 mg | ORAL_TABLET | Freq: Every day | ORAL | Status: DC

## 2012-07-07 MED ORDER — PREDNISONE 20 MG PO TABS: 60.0000 mg | ORAL_TABLET | Freq: Every day | ORAL | Status: DC

## 2012-07-07 NOTE — Progress Notes (Signed)
ANTICOAGULATION CONSULT NOTE - Follow Up Consult  Pharmacy Consult for warfarin Indication: atrial fibrillation  Allergies  Allergen Reactions  . Penicillins     Patient Measurements: Height: 6' (182.9 cm) Weight: 225 lb 8.5 oz (102.3 kg) IBW/kg (Calculated) : 73.1   Vital Signs: Temp: 98.3 F (36.8 C) (06/21 0923) Temp src: Oral (06/21 0923) BP: 101/51 mmHg (06/21 0923) Pulse Rate: 59 (06/21 0923)  Labs:  Recent Labs  07/05/12 0610 07/06/12 0405 07/07/12 0600  HGB 10.6*  --   --   HCT 32.8*  --   --   PLT 269  --   --   LABPROT 35.3* 38.0* 37.4*  INR 3.81* 4.21* 4.12*  CREATININE 0.63  --   --     Estimated Creatinine Clearance: 106.3 ml/min (by C-G formula based on Cr of 0.63).   Medications:  Scheduled:  . hydrALAZINE  25 mg Oral Q8H  . ipratropium  0.5 mg Nebulization BID  . levalbuterol  0.63 mg Nebulization BID  . levofloxacin  500 mg Oral Daily  . metoprolol succinate  50 mg Oral Daily  . mupirocin ointment  1 application Nasal BID  . pantoprazole  40 mg Oral Daily  . potassium chloride SA  40 mEq Oral BID  . predniSONE  60 mg Oral Q breakfast  . sertraline  50 mg Oral Daily  . sodium chloride  3 mL Intravenous Q12H  . Warfarin - Pharmacist Dosing Inpatient   Does not apply q1800    Assessment: 19 YOM admitted with SOB and hx of AFib. INR on arrival >8, today is decreasing to 4.12. Noted patient is on Levaquin and prednisone which can affect INR when warfarin is restarted. H/H/Plt stable, no bleeding noted.  Goal of Therapy:  INR 2-3 Monitor platelets by anticoagulation protocol: Yes   Plan:  1. Hold warfarin tonight d/t elevated INR 2. Daily PT/INR  Serine Kea D. Emeri Estill, PharmD Clinical Pharmacist Pager: [phone] 07/07/2012 9:48 AM

## 2012-07-07 NOTE — Discharge Summary (Signed)
Physician Discharge Summary  NIKOLIS BERENT ZOX:[id number] DOB: Feb 15, 1940 DOA: 07/02/2012  PCP: No primary provider on file.  Admit date: 07/02/2012 Discharge date: 07/07/2012  Time spent: 45 minutes  Discharge Diagnoses:  Active Problems:   Acute respiratory failure with hypoxia   Right upper lobe pneumonia   CHF (congestive heart failure)   Atrial fibrillation with RVR   Warfarin-induced coagulopathy   Hyponatremia   Hypokalemia   Chest pain, atypical/chronic   HTN (hypertension)   Interstitial lung disease   NSVT (nonsustained ventricular tachycardia)   Hypomagnesemia   Discharge Condition: stable  Diet recommendation: heart healthy  Filed Weights   07/03/12 0400 07/04/12 0451 07/06/12 2125  Weight: 111.9 kg (246 lb 11.1 oz) 111.9 kg (246 lb 11.1 oz) 102.3 kg (225 lb 8.5 oz)    History of present illness:  72 year old male patient from [place], [place] with multiple medical problems including chronic atrial fibrillation, COPD and hypertension. Patient endorsed to the admitting physician SOB for 3 days which had been progressively worsening. Also associated with chest pressure that spontaneously resolved. Unfortunately the chest pressure recurred and because of the shortness of breath the patient presented to the ER for treatment.  In the ER he was found to be acutely short of breath and hypoxic and placed on BiPAP. Chest x-ray was consistent with either multifocal pneumonia or interstitial lung disease and a question of CHF. Patient was also found to have rapid ventricular response with his underlying atrial fibrillation. He was given a dose of IV Lasix in the emergency department and started on Levaquin for pneumonia. At the time the admitting physician arrived to evaluate the patient he had improved significantly and was able to be weaned off of BiPAP.   Hospital Course:  Acute respiratory failure with hypoxia due to:  A) RUL PNA  B) CHF- Diastolic dysfunction?   C) ILD  - cough present x > 1 month with productive yellow sputum and also recent hospitalization in [place] ~5 weeks ago  -Levaquin initiated at time of admit- unable to obtain sputum culture since cough remained non productive while here  -also h/o of "CHF"- ECHO revealed normal EF and no diastolic dysfunction (see below)  - Lasix 40 q 8hrs and Aldactone (was on Bumex and Aldactone at home) with resultant negative balance of 4 L  - diuretics dc'd 6/20 since no convincing clinical evidence of ongoing CHF and pt actually euvolemic  -plan is to DC Aldactone after dc and monitor weight and give preadmit Bumex based on weight gain  -CT suggestive of ILD- due to possibility of a pneumonitis, started on steroids- autoimmune w/u unrevealing-Pulmonary rec taper/dc steroids and complete pneumonia treatment both over 10 days  -felt Reglan contributing to persistent malaise so consider dc  -Pulmonary surmises may have diastolic dysfunction based on left atrial dialtation  - does not need continuous O2  Atrial fibrillation with RVR  -rate better controlled with correction of hypoxia   Warfarin-induced coagulopathy  -INR 8.3 (6/17) so held Coumadin- INR has not yet decreased to ~ 2.0  -No signs of active bleeding  -Pharmacy managing  - Xarelto not covered by his insurance and Pradaxa/ Eliquis too expesive for him ( about $70/ month)  Therefore will cont Coumadin-will ask for follow up labs Monday after dc   HTN (hypertension)  -BP controlled   Hyponatremia  -improvement in Na+ after Lasix so will follow   NSVT/Hypokalemia/Hypomagnesemia  -Mg 1.7 so IV repleted  -K improved after repletion and  regular dosing stopped with dc of Lasix   Chest pain, atypical/chronic  -more c/w costochondritis  -enzymes negative and ECHO without RWMA  -EKG 's have been non ischemic   ? Dementia/depression  -apparent progressive memory loss that has worsened somewhat with acute illness  -follow  -dtr says  PCP started Zoloft the Monday prior to admit   Code Status: Full   Isolation: Contact isolation for MRSA PCR positive status  Consultants:  Pulmonary medicine  Procedures:  2-D echocardiogram  - Left ventricle: The cavity size was normal. Wall thickness was normal. Systolic function was normal. The estimated ejection fraction was in the range of 60% to 65%. The study is not technically sufficient to allow evaluation of LV diastolic function. - Left atrium: The atrium was severely dilated. - Right ventricle: The cavity size was mildly dilated. - Right atrium: The atrium was severely dilated. - Pulmonary arteries: Systolic pressure was mildly to moderately increased. PA peak pressure: 45mm Hg (S).  Discharge Exam: Filed Vitals:   07/06/12 2125 07/06/12 2245 07/07/12 0514 07/07/12 0923  BP: 104/65 117/78 108/57 101/51  Pulse: 80 93 94 59  Temp: 97.4 F (36.3 C)  97.7 F (36.5 C) 98.3 F (36.8 C)  TempSrc: Oral  Oral Oral  Resp: 20  20 18   Height:      Weight: 102.3 kg (225 lb 8.5 oz)     SpO2: 93%  95% 96%    General: AAO x 3, no distress Cardiovascular: IIRR, no murmurs Respiratory: coarse crackles b/l   Discharge Instructions  Discharge Orders   Future Appointments Provider Department Dept Phone   08/03/2012 2:45 PM Oretha Milch, MD [facility] [phone]   Future Orders Complete By Expires     Diet - low sodium heart healthy  As directed     Discharge instructions  As directed     Comments:      If you become short of breath please call the lung doctors at [facility]- number has been provided for you in follow up section.    Increase activity slowly  As directed         Medication List    STOP taking these medications       metoCLOPramide 10 MG tablet  Commonly known as:  REGLAN      TAKE these medications       bumetanide 2 MG tablet  Commonly known as:  BUMEX  Take 2 mg by mouth daily.     bumetanide 2 MG tablet  Commonly  known as:  BUMEX  Take 2 mg by mouth daily as needed. May take additional 2 mg for an increase of 3 lbs. (Take extra potassium, with extra Bumex dose)     esomeprazole 40 MG capsule  Commonly known as:  NEXIUM  Take 40 mg by mouth daily before breakfast.     guaiFENesin-codeine 100-10 MG/5ML syrup  Commonly known as:  ROBITUSSIN AC  Take 10 mLs by mouth every 4 (four) hours as needed for cough.     hydrALAZINE 25 MG tablet  Commonly known as:  APRESOLINE  Take 25 mg by mouth 3 (three) times daily.     ipratropium-albuterol 0.5-2.5 (3) MG/3ML Soln  Commonly known as:  DUONEB  Take 3 mLs by nebulization every 4 (four) hours as needed (shortness of breath or wheeze).     levofloxacin 500 MG tablet  Commonly known as:  LEVAQUIN  Take 1 tablet (500 mg total) by mouth daily.  magic mouthwash Soln  Take 15 mLs by mouth every 4 (four) hours as needed (for sore tongue (swish and spit)).     magnesium hydroxide 400 MG/5ML suspension  Commonly known as:  MILK OF MAGNESIA  Take 30 mLs by mouth daily as needed for constipation (for constipation with oxycodone).     metoprolol succinate 50 MG 24 hr tablet  Commonly known as:  TOPROL-XL  Take 50 mg by mouth 2 (two) times daily. Take with or immediately following a meal.     oxyCODONE 10 MG 12 hr tablet  Commonly known as:  OXYCONTIN  Take 10 mg by mouth every 4 (four) hours as needed for pain.     potassium chloride SA 20 MEQ tablet  Commonly known as:  K-DUR,KLOR-CON  Take 40 mEq by mouth daily.     potassium chloride SA 20 MEQ tablet  Commonly known as:  K-DUR,KLOR-CON  Take 40 mEq by mouth daily as needed (Take an extra 40 meq when taking extra Bumex).     predniSONE 20 MG tablet  Commonly known as:  DELTASONE  Take 3 tablets (60 mg total) by mouth daily with breakfast.     sertraline 50 MG tablet  Commonly known as:  ZOLOFT  Take 50 mg by mouth daily.     spironolactone 25 MG tablet  Commonly known as:  ALDACTONE  Take  25 mg by mouth daily.     warfarin 5 MG tablet  Commonly known as:  COUMADIN  Take 5-7.5 mg by mouth daily. 10 mg on Friday, Saturday, Sunday, and Monday. Take 7.5 mg on Tuesday, Wednesday, and Thursday.       Allergies  Allergen Reactions  . Penicillins        Follow-up Information   Call [facility]. (Call office to make sure you have an appt for 2-3 weeks from now)    Contact information:   [address] [place] [address] [phone]       The results of significant diagnostics from this hospitalization (including imaging, microbiology, ancillary and laboratory) are listed below for reference.    Significant Diagnostic Studies: Dg Chest 2 View  07/05/2012   *RADIOLOGY REPORT*  Clinical Data: Chest pain for 1 month with shortness of breath weakness and hypertension  CHEST - 2 VIEW  Comparison: 07/02/2012 and CT 07/03/2012  Findings: Cardiomegaly is identified and appears stable in degree. Aortic calcification is again noted.  The lung fields demonstrate a coarse bilateral interstitial pattern which is unchanged given the slightly improved lung volumes seen today.  No new focal infiltrates or signs of pleural fluid are seen.  No evidence for congestive failure is noted.  Indwelling left subclavian pacer is stable  IMPRESSION: Unchanged cardiomegaly and diffuse coarse interstitial infiltrates. No new findings identified   Original Report Authenticated By: Rhodia Albright, M.D.   Ct Chest W Contrast  07/03/2012   *RADIOLOGY REPORT*  Clinical Data: Shortness of breath, nonproductive cough, chest pain.  Infiltrates.  CT CHEST WITH CONTRAST  Technique:  Multidetector CT imaging of the chest was performed following the standard protocol during bolus administration of intravenous contrast.  Contrast: 80mL OMNIPAQUE IOHEXOL 300 MG/ML  SOLN  Comparison: Chest radiograph dated 07/02/2012  Findings: Tree-in-bud nodularity, right upper lobe predominant (series 3/image 16),  suspicious for pneumonia.  Lower lobe bronchiectasis with associated patchy bibasilar opacities, likely reflecting sequela of chronic bronchitis and dependent atelectasis.  Underlying multifocal patchy opacities / fibrosis in the upper lobes, suspicious for  chronic interstitial lung disease.  No frank interstitial edema.  No pleural effusion or pneumothorax.  Visualized thyroid is unremarkable.  Cardiomegaly.  No pericardial effusion.  Coronary atherosclerosis. There are calcifications of the aortic arch.  Mediastinal lymphadenopathy, including: --12 mm short-axis prevascular node (series 2/image 23) --13 mm short-axis right paratracheal node (series 2/image 23) --11 mm short-axis AP window node (series 2/image 25) --15 mm short-axis subcarinal node (series 2/image 34)  Visualized upper abdomen is unremarkable.  Degenerative changes of the visualized thoracolumbar spine.  IMPRESSION: Tree-in-bud nodularity, right upper lobe predominant, suspicious for pneumonia.  Atypical/viral infection is possible.  Suspected chronic interstitial lung disease with chronic bronchitis in the bilateral lower lobes.  Cardiomegaly.  No frank interstitial edema.  Mediastinal lymphadenopathy, likely reactive.   Original Report Authenticated By: Charline Bills, M.D.   Dg Chest Port 1 View  07/02/2012   *RADIOLOGY REPORT*  Clinical Data: Shortness of breath.  Respiratory distress.  PORTABLE CHEST - 1 VIEW  Comparison: No priors.  Findings: Mild diffuse interstitial prominence and extensive peribronchial cuffing.  Bibasilar opacities favored to predominately reflect subsegmental atelectasis.  No definite pleural effusions.  Mild congestion of the pulmonary vasculature. Heart size is mildly enlarged. The patient is rotated to the left on today's exam, resulting in distortion of the mediastinal contours and reduced diagnostic sensitivity and specificity for mediastinal pathology.  Atherosclerosis in the thoracic aorta. 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  IMPRESSION: 1.  Diffuse peribronchial cuffing and interstitial prominence, concerning for severe bronchitis, potentially with developing multifocal bronchopneumonia. 2.  Mild cardiomegaly with pulmonary venous congestion. 3.  Atherosclerosis.   Original Report Authenticated By: Trudie Reed, M.D.    Microbiology: Recent Results (from the past 240 hour(s))  MRSA PCR SCREENING     Status: Abnormal   Collection Time    07/03/12  1:56 AM      Result Value Range Status   MRSA by PCR POSITIVE (*) NEGATIVE Final   Comment:            The GeneXpert MRSA Assay (FDA     approved for NASAL specimens     only), is one component of a     comprehensive MRSA colonization     surveillance program. It is not     intended to diagnose MRSA     infection nor to guide or     monitor treatment for     MRSA infections.     RESULT CALLED TO, READ BACK BY AND VERIFIED WITH:     C.REID,RN 1478 07/03/12 M.CAMPBELL     Labs: Basic Metabolic Panel:  Recent Labs Lab 07/02/12 2201 07/03/12 0845 07/04/12 0845 07/05/12 0610  NA 129* 130* 132* 134*  K 3.2* 3.4* 3.4* 3.9  CL 93* 92* 95* 96  CO2 23 21 27 28   GLUCOSE 106* 157* 125* 119*  BUN 9 9 14 15   CREATININE 0.50 0.57 0.68 0.63  CALCIUM 9.1 8.9 9.0 9.3  MG  --  1.7  --  2.1  PHOS  --  2.9  --   --    Liver Function Tests:  Recent Labs Lab 07/02/12 2201 07/03/12 0845 07/04/12 0845  AST 32 36 39*  ALT 11 13 17   ALKPHOS 104 120* 155*  BILITOT 1.3* 1.1 0.6  PROT 6.5 6.5 6.2  ALBUMIN 2.9* 2.8* 2.8*   No results found for this basename: LIPASE, AMYLASE,  in the last  168 hours No results found for this basename: AMMONIA,  in the last 168 hours CBC:  Recent Labs Lab 07/02/12 2201 07/03/12 0845 07/04/12 0845 07/05/12 0610  WBC 10.3 9.1 9.5 7.9  NEUTROABS 8.6* 8.2*  --   --   HGB 11.2* 10.9* 10.5* 10.6*  HCT 33.3* 32.3* 31.7*  32.8*  MCV 96.0 95.8 96.9 97.0  PLT 245 235 292 269   Cardiac Enzymes:  Recent Labs Lab 07/03/12 0250 07/03/12 0845 07/03/12 1419  TROPONINI <0.30 <0.30 <0.30   BNP: BNP (last 3 results)  Recent Labs  07/02/12 2201  PROBNP 3078.0*   CBG:  Recent Labs Lab 07/06/12 1651  GLUCAP 101*       Signed:  Ishia Tenorio  [facility] 07/07/2012, 3:22 PM

## 2012-07-07 NOTE — Care Management Note (Addendum)
Pt active with [facility]. MD orders for resumption of care entered. Cm to fax MD orders to [facility] at [phone]. Confirmation received. Pt request hospital bed. MD order entered. [facility] notified.No other needs identified.    Roxy Manns Mandy Fitzwater,RN,BSN [phone]

## 2012-07-07 NOTE — Progress Notes (Signed)
PLEASE [facility] WHEN PT IS DISCHARGED @ [phone] SO THEY CAN SCHEDULE HOME VISIT.

## 2012-07-07 NOTE — Progress Notes (Signed)
07/07/2012 patient ambulate without oxygen in the room 5 times, before stared he was 94%RA and after walking 91%. Dr Maurine Simmering. [facility] RN.

## 2012-08-03 ENCOUNTER — Inpatient Hospital Stay: Payer: Self-pay | Admitting: Pulmonary Disease

## 2012-12-17 ENCOUNTER — Encounter: Payer: Self-pay | Admitting: Family

## 2012-12-17 ENCOUNTER — Ambulatory Visit (INDEPENDENT_AMBULATORY_CARE_PROVIDER_SITE_OTHER): Payer: Medicare Other | Admitting: Family

## 2012-12-17 VITALS — BP 128/80 | HR 82 | Ht 74.0 in | Wt 248.0 lb

## 2012-12-17 DIAGNOSIS — I4891 Unspecified atrial fibrillation: Secondary | ICD-10-CM

## 2012-12-17 DIAGNOSIS — J849 Interstitial pulmonary disease, unspecified: Secondary | ICD-10-CM

## 2012-12-17 DIAGNOSIS — I10 Essential (primary) hypertension: Secondary | ICD-10-CM

## 2012-12-17 DIAGNOSIS — I509 Heart failure, unspecified: Secondary | ICD-10-CM

## 2012-12-17 DIAGNOSIS — J8410 Pulmonary fibrosis, unspecified: Secondary | ICD-10-CM

## 2012-12-17 DIAGNOSIS — R0789 Other chest pain: Secondary | ICD-10-CM

## 2012-12-17 DIAGNOSIS — E876 Hypokalemia: Secondary | ICD-10-CM

## 2012-12-17 LAB — CBC WITH DIFFERENTIAL/PLATELET
Basophils Absolute: 0 10*3/uL (ref 0.0–0.1)
Eosinophils Absolute: 0.3 10*3/uL (ref 0.0–0.7)
Lymphocytes Relative: 22.5 % (ref 12.0–46.0)
MCHC: 33.1 g/dL (ref 30.0–36.0)
MCV: 92.7 fl (ref 78.0–100.0)
Monocytes Absolute: 0.7 10*3/uL (ref 0.1–1.0)
Neutrophils Relative %: 64.3 % (ref 43.0–77.0)
RDW: 17.1 % — ABNORMAL HIGH (ref 11.5–14.6)

## 2012-12-17 LAB — BASIC METABOLIC PANEL
CO2: 27 mEq/L (ref 19–32)
Chloride: 99 mEq/L (ref 96–112)
Potassium: 3.2 mEq/L — ABNORMAL LOW (ref 3.5–5.1)
Sodium: 138 mEq/L (ref 135–145)

## 2012-12-17 LAB — HEPATIC FUNCTION PANEL
ALT: 12 U/L (ref 0–53)
AST: 16 U/L (ref 0–37)
Alkaline Phosphatase: 91 U/L (ref 39–117)
Bilirubin, Direct: 0.2 mg/dL (ref 0.0–0.3)
Total Bilirubin: 0.6 mg/dL (ref 0.3–1.2)
Total Protein: 6.7 g/dL (ref 6.0–8.3)

## 2012-12-17 LAB — POCT INR: INR: 1.8

## 2012-12-17 MED ORDER — HYDROCODONE-ACETAMINOPHEN 10-325 MG PO TABS: 1.0000 | ORAL_TABLET | Freq: Three times a day (TID) | ORAL | Status: DC | PRN

## 2012-12-17 NOTE — Patient Instructions (Signed)
Today only, take 10mg . Then continue 7.5mg  daily. Recheck in 3 weeks.   Anticoagulation Dose Instructions as of 12/17/2012     Glynis Smiles Tue Wed Thu Fri Sat   New Dose 7.5 mg 7.5 mg 7.5 mg 7.5 mg 7.5 mg 7.5 mg 7.5 mg    Description       Today only, take 10mg . Then continue 7.5mg  daily. Recheck in 3 weeks.

## 2012-12-17 NOTE — Progress Notes (Signed)
Subjective:    Patient ID: Billy Casey, male    DOB: 1940-05-10, 72 y.o.   MRN: [id number]  HPI  72 year old white male, nonsmoker, patient to the practice and to be established. He has a history of congestive heart failure, hypertension, atrial fibrillation, hypokalemia, hyponatremia. He is currently not well him on medications. He relocated here from [facility] Is currently on Coumadin 7-1/2 mg daily. Reports he has not had his INR checked in approximately 5 months. He has not establish with a cardiologist.  Patient has a history of osteoarthritis and is requesting medication for pain. He had been taking oxycodone in the past as needed. Has not been under the care of a pain clinic. Reports he does not take the medication daily.  Review of Systems  Constitutional: Negative.   Respiratory: Negative.   Cardiovascular: Negative.   Gastrointestinal: Negative.   Endocrine: Negative.   Genitourinary: Negative.   Musculoskeletal: Positive for arthralgias.  Skin: Negative.   Allergic/Immunologic: Negative.   Neurological: Negative.   Hematological: Negative.   Psychiatric/Behavioral: Negative.    Past Medical History  Diagnosis Date  . COPD (chronic obstructive pulmonary disease)   . CHF (congestive heart failure)   . Hypertension   . Atrial fibrillation     History   Social History  . Marital Status: Unknown    Spouse Name: N/A    Number of Children: N/A  . Years of Education: N/A   Occupational History  . Not on file.   Social History Main Topics  . Smoking status: Never Smoker   . Smokeless tobacco: Not on file  . Alcohol Use: No  . Drug Use: No  . Sexual Activity: Not on file   Other Topics Concern  . Not on file   Social History Narrative  . No narrative on file    Past Surgical History  Procedure Laterality Date  . Cholecystectomy    . Permanent pacemaker insertion      Family History  Problem Relation Age of Onset  . Stroke Mother     . CAD Father     Allergies  Allergen Reactions  . Penicillins     Current Outpatient Prescriptions on File Prior to Visit  Medication Sig Dispense Refill  . Alum & Mag Hydroxide-Simeth (MAGIC MOUTHWASH) SOLN Take 15 mLs by mouth every 4 (four) hours as needed (for sore tongue (swish and spit)).      . bumetanide (BUMEX) 2 MG tablet Take 2 mg by mouth daily.      . bumetanide (BUMEX) 2 MG tablet Take 2 mg by mouth daily as needed. May take additional 2 mg for an increase of 3 lbs. (Take extra potassium, with extra Bumex dose)      . esomeprazole (NEXIUM) 40 MG capsule Take 40 mg by mouth daily before breakfast.      . hydrALAZINE (APRESOLINE) 25 MG tablet Take 25 mg by mouth 3 (three) times daily.      Marland Kitchen ipratropium-albuterol (DUONEB) 0.5-2.5 (3) MG/3ML SOLN Take 3 mLs by nebulization every 4 (four) hours as needed (shortness of breath or wheeze).      . magnesium hydroxide (MILK OF MAGNESIA) 400 MG/5ML suspension Take 30 mLs by mouth daily as needed for constipation (for constipation with oxycodone).      . metoprolol succinate (TOPROL-XL) 50 MG 24 hr tablet Take 50 mg by mouth 2 (two) times daily. Take with or immediately following a meal.      .  potassium chloride SA (K-DUR,KLOR-CON) 20 MEQ tablet Take 40 mEq by mouth daily.      . potassium chloride SA (K-DUR,KLOR-CON) 20 MEQ tablet Take 40 mEq by mouth daily as needed (Take an extra 40 meq when taking extra Bumex).      . predniSONE (DELTASONE) 20 MG tablet Take 3 tablets (60 mg total) by mouth daily with breakfast.  14 tablet  0  . sertraline (ZOLOFT) 50 MG tablet Take 50 mg by mouth daily.      Marland Kitchen warfarin (COUMADIN) 5 MG tablet Take 5-7.5 mg by mouth daily. 10 mg on Friday, Saturday, Sunday, and Monday. Take 7.5 mg on Tuesday, Wednesday, and Thursday.      Marland Kitchen guaiFENesin-codeine (ROBITUSSIN AC) 100-10 MG/5ML syrup Take 10 mLs by mouth every 4 (four) hours as needed for cough.      Marland Kitchen levofloxacin (LEVAQUIN) 500 MG tablet Take 1 tablet  (500 mg total) by mouth daily.  5 tablet  0  . oxyCODONE (OXYCONTIN) 10 MG 12 hr tablet Take 10 mg by mouth every 4 (four) hours as needed for pain.      Marland Kitchen spironolactone (ALDACTONE) 25 MG tablet Take 25 mg by mouth daily.       No current facility-administered medications on file prior to visit.    BP 128/80  Pulse 82  Ht 6\' 2"  (1.88 m)  Wt 248 lb (112.492 kg)  BMI 31.83 kg/m2chart    Objective:   Physical Exam  Constitutional: He is oriented to person, place, and time. He appears well-developed and well-nourished.  HENT:  Right Ear: External ear normal.  Left Ear: External ear normal.  Nose: Nose normal.  Mouth/Throat: Oropharynx is clear and moist.  Neck: Normal range of motion. Neck supple. No thyromegaly present.  Cardiovascular: Normal rate and normal heart sounds.   Atrial fibrillation  Pulmonary/Chest: Effort normal and breath sounds normal.  Neurological: He is alert and oriented to person, place, and time.  Skin: Skin is warm and dry.  Psychiatric: He has a normal mood and affect.          Assessment & Plan:  Assessment: 1. Congestive heart C. Hypertension 3. A short fibrillation 4. Hypokalemia 5. Hyponatremia 6. Osteoarthritis  Plan: Return for INR check in 3 weeks. Take an extra half tablet of Coumadin today only. Continue 7.5 mg once daily. Continue current medications. Lab sent to include BMP, CBC, LFTs, lipids will notify patient of results. At this point, we'll prescribe hydrocodone as needed for pain. If he begins to require pain medication on a daily basis for osteoarthritis and he will be referred to the pain clinic. Patient verbalizes understanding.

## 2013-01-04 ENCOUNTER — Encounter: Payer: Self-pay | Admitting: *Deleted

## 2013-01-07 ENCOUNTER — Ambulatory Visit (INDEPENDENT_AMBULATORY_CARE_PROVIDER_SITE_OTHER): Payer: Medicare Other | Admitting: General Practice

## 2013-01-07 ENCOUNTER — Ambulatory Visit: Payer: Medicare Other

## 2013-01-07 DIAGNOSIS — I4891 Unspecified atrial fibrillation: Secondary | ICD-10-CM

## 2013-01-07 NOTE — Progress Notes (Signed)
Pre-visit discussion using our clinic review tool. No additional management support is needed unless otherwise documented below in the visit note.  

## 2013-01-16 ENCOUNTER — Telehealth: Payer: Self-pay | Admitting: Family

## 2013-01-16 MED ORDER — SERTRALINE HCL 50 MG PO TABS: 50.0000 mg | ORAL_TABLET | Freq: Every day | ORAL | Status: DC

## 2013-01-16 NOTE — Telephone Encounter (Signed)
Refill sent for zoloft. Message forwarded to Bailey Mech for warfarin refill

## 2013-01-16 NOTE — Telephone Encounter (Signed)
Pt's daughter calling to request refills of warfarin (COUMADIN) 5 MG tablet and sertraline (ZOLOFT) 50 MG tablet sent to [facility], pt is out of medications and did not realize he had no additional refills remaining.

## 2013-01-18 ENCOUNTER — Other Ambulatory Visit: Payer: Self-pay | Admitting: General Practice

## 2013-01-18 MED ORDER — WARFARIN SODIUM 5 MG PO TABS: ORAL_TABLET | ORAL | Status: DC

## 2013-01-21 NOTE — Telephone Encounter (Signed)
Pt never received the refill for warfarin 5mg . Pt is now out. Can you pls refill today?  [facility] [address] Can you call daughter when this is done? Thanks!

## 2013-01-21 NOTE — Telephone Encounter (Signed)
Spoke with [name], patient's dtr.  Informed patient that re-fill was sent in at 8:32 on 1/2 [facility] [place]  Dtr says that she will check again and call if it's not there.

## 2013-02-04 ENCOUNTER — Ambulatory Visit: Payer: Medicare Other

## 2013-02-07 ENCOUNTER — Ambulatory Visit (INDEPENDENT_AMBULATORY_CARE_PROVIDER_SITE_OTHER): Payer: Medicare Other | Admitting: General Practice

## 2013-02-07 ENCOUNTER — Ambulatory Visit: Payer: Self-pay | Admitting: General Practice

## 2013-02-07 DIAGNOSIS — I4891 Unspecified atrial fibrillation: Secondary | ICD-10-CM

## 2013-02-07 DIAGNOSIS — Z5181 Encounter for therapeutic drug level monitoring: Secondary | ICD-10-CM

## 2013-02-07 LAB — POCT INR: INR: 1.2

## 2013-02-07 NOTE — Progress Notes (Signed)
Pre-visit discussion using our clinic review tool. No additional management support is needed unless otherwise documented below in the visit note.  

## 2013-02-21 ENCOUNTER — Encounter: Payer: Self-pay | Admitting: Family

## 2013-02-21 ENCOUNTER — Ambulatory Visit: Payer: Medicare Other

## 2013-02-21 ENCOUNTER — Ambulatory Visit (INDEPENDENT_AMBULATORY_CARE_PROVIDER_SITE_OTHER): Payer: Medicare Other | Admitting: Family

## 2013-02-21 VITALS — BP 130/90 | HR 76 | Temp 97.7°F | Resp 20 | Ht 74.0 in | Wt 250.0 lb

## 2013-02-21 DIAGNOSIS — J209 Acute bronchitis, unspecified: Secondary | ICD-10-CM

## 2013-02-21 DIAGNOSIS — I4891 Unspecified atrial fibrillation: Secondary | ICD-10-CM

## 2013-02-21 DIAGNOSIS — J069 Acute upper respiratory infection, unspecified: Secondary | ICD-10-CM

## 2013-02-21 DIAGNOSIS — Z5181 Encounter for therapeutic drug level monitoring: Secondary | ICD-10-CM

## 2013-02-21 LAB — POCT INR: INR: 2

## 2013-02-21 MED ORDER — METHYLPREDNISOLONE 4 MG PO KIT: PACK | ORAL | Status: AC

## 2013-02-21 NOTE — Progress Notes (Signed)
Pre visit review using our clinic review tool, if applicable. No additional management support is needed unless otherwise documented below in the visit note. 

## 2013-02-21 NOTE — Progress Notes (Signed)
   Subjective:    Patient ID: Billy Casey, male    DOB: 1940/03/26, 73 y.o.   MRN: [id number]  HPI Comments: 73 year old, tobacco chewer, with a history of atrial fibrillation, presents today with complaints of cough, congestion, runny nose, and chest wall tenderness x1-1/2 weeks. Has been taking over-the-counter BCs, cough medication, and use an albuterol treatments that helped. He is also here to have his INR checked. Has not missed any doses. No bleeding or bruising.     Review of Systems  Constitutional: Negative.   HENT: Positive for congestion, rhinorrhea and sneezing.   Respiratory: Positive for cough.   Cardiovascular: Negative.   Gastrointestinal: Negative.   Musculoskeletal: Negative.   Skin: Negative.   Neurological: Negative.   Psychiatric/Behavioral: Negative.        Objective:   Physical Exam  Constitutional: He is oriented to person, place, and time. He appears well-developed and well-nourished.  HENT:  Right Ear: External ear normal.  Left Ear: External ear normal.  Nose: Nose normal.  Mouth/Throat: Oropharynx is clear and moist.  Neck: Normal range of motion. Neck supple.  Cardiovascular: Normal rate, regular rhythm and normal heart sounds.   Pulmonary/Chest: Effort normal and breath sounds normal.  Chest wall tenderness to palpation of the left chest wall.   Abdominal: Soft. Bowel sounds are normal.  Neurological: He is alert and oriented to person, place, and time.  Skin: Skin is warm and dry.  Psychiatric: He has a normal mood and affect.          Assessment & Plan:  Billy Casey was seen today for cough and nasal congestion.  Diagnoses and associated orders for this visit:  Acute upper respiratory infections of unspecified site  Acute bronchitis  Encounter for therapeutic drug monitoring  Atrial fibrillation with RVR  Other Orders - methylPREDNISolone (MEDROL DOSEPAK) 4 MG tablet; follow package directions - POCT INR    followup  appointment scheduled in 2 weeks Coumadin clinic to recheck INR. He is currently stable. Continue current dosage. Encouraged cessation of tobacco use. Call the office with any questions or concerns.

## 2013-02-21 NOTE — Patient Instructions (Addendum)
Upper Respiratory Infection, Adult An upper respiratory infection (URI) is also sometimes known as the common cold. The upper respiratory tract includes the nose, sinuses, throat, trachea, and bronchi. Bronchi are the airways leading to the lungs. Most people improve within 1 week, but symptoms can last up to 2 weeks. A residual cough may last even longer.  CAUSES Many different viruses can infect the tissues lining the upper respiratory tract. The tissues become irritated and inflamed and often become very moist. Mucus production is also common. A cold is contagious. You can easily spread the virus to others by oral contact. This includes kissing, sharing a glass, coughing, or sneezing. Touching your mouth or nose and then touching a surface, which is then touched by another person, can also spread the virus. SYMPTOMS  Symptoms typically develop 1 to 3 days after you come in contact with a cold virus. Symptoms vary from person to person. They may include:  Runny nose.  Sneezing.  Nasal congestion.  Sinus irritation.  Sore throat.  Loss of voice (laryngitis).  Cough.  Fatigue.  Muscle aches.  Loss of appetite.  Headache.  Low-grade fever. DIAGNOSIS  You might diagnose your own cold based on familiar symptoms, since most people get a cold 2 to 3 times a year. Your caregiver can confirm this based on your exam. Most importantly, your caregiver can check that your symptoms are not due to another disease such as strep throat, sinusitis, pneumonia, asthma, or epiglottitis. Blood tests, throat tests, and X-rays are not necessary to diagnose a common cold, but they may sometimes be helpful in excluding other more serious diseases. Your caregiver will decide if any further tests are required. RISKS AND COMPLICATIONS  You may be at risk for a more severe case of the common cold if you smoke cigarettes, have chronic heart disease (such as heart failure) or lung disease (such as asthma), or if  you have a weakened immune system. The very young and very old are also at risk for more serious infections. Bacterial sinusitis, middle ear infections, and bacterial pneumonia can complicate the common cold. The common cold can worsen asthma and chronic obstructive pulmonary disease (COPD). Sometimes, these complications can require emergency medical care and may be life-threatening. PREVENTION  The best way to protect against getting a cold is to practice good hygiene. Avoid oral or hand contact with people with cold symptoms. Wash your hands often if contact occurs. There is no clear evidence that vitamin C, vitamin E, echinacea, or exercise reduces the chance of developing a cold. However, it is always recommended to get plenty of rest and practice good nutrition. TREATMENT  Treatment is directed at relieving symptoms. There is no cure. Antibiotics are not effective, because the infection is caused by a virus, not by bacteria. Treatment may include:  Increased fluid intake. Sports drinks offer valuable electrolytes, sugars, and fluids.  Breathing heated mist or steam (vaporizer or shower).  Eating chicken soup or other clear broths, and maintaining good nutrition.  Getting plenty of rest.  Using gargles or lozenges for comfort.  Controlling fevers with ibuprofen or acetaminophen as directed by your caregiver.  Increasing usage of your inhaler if you have asthma. Zinc gel and zinc lozenges, taken in the first 24 hours of the common cold, can shorten the duration and lessen the severity of symptoms. Pain medicines may help with fever, muscle aches, and throat pain. A variety of non-prescription medicines are available to treat congestion and runny nose. Your caregiver   can make recommendations and may suggest nasal or lung inhalers for other symptoms.  HOME CARE INSTRUCTIONS   Only take over-the-counter or prescription medicines for pain, discomfort, or fever as directed by your  caregiver.  Use a warm mist humidifier or inhale steam from a shower to increase air moisture. This may keep secretions moist and make it easier to breathe.  Drink enough water and fluids to keep your urine clear or pale yellow.  Rest as needed.  Return to work when your temperature has returned to normal or as your caregiver advises. You may need to stay home longer to avoid infecting others. You can also use a face mask and careful hand washing to prevent spread of the virus. SEEK MEDICAL CARE IF:   After the first few days, you feel you are getting worse rather than better.  You need your caregiver's advice about medicines to control symptoms.  You develop chills, worsening shortness of breath, or brown or red sputum. These may be signs of pneumonia.  You develop yellow or brown nasal discharge or pain in the face, especially when you bend forward. These may be signs of sinusitis.  You develop a fever, swollen neck glands, pain with swallowing, or white areas in the back of your throat. These may be signs of strep throat. SEEK IMMEDIATE MEDICAL CARE IF:   You have a fever.  You develop severe or persistent headache, ear pain, sinus pain, or chest pain.  You develop wheezing, a prolonged cough, cough up blood, or have a change in your usual mucus (if you have chronic lung disease).  You develop sore muscles or a stiff neck. Document Released: 06/29/2000 Document Revised: 03/28/2011 Document Reviewed: 05/07/2010 [facility] Patient Information 2014 [employer] [place].   Anticoagulation Dose Instructions as of 02/21/2013     Glynis SmilesSun Mon Tue Wed Thu Fri Sat   New Dose 7.5 mg 7.5 mg 7.5 mg 7.5 mg 7.5 mg 7.5 mg 7.5 mg    Description       Continue taking the 1 1/2 tablets daily.  Re-check in 2 weeks.

## 2013-02-27 ENCOUNTER — Encounter: Payer: Self-pay | Admitting: Family

## 2013-03-07 ENCOUNTER — Ambulatory Visit: Payer: Medicare Other

## 2013-03-11 ENCOUNTER — Ambulatory Visit (INDEPENDENT_AMBULATORY_CARE_PROVIDER_SITE_OTHER): Payer: Medicare Other | Admitting: Family

## 2013-03-11 ENCOUNTER — Telehealth: Payer: Self-pay | Admitting: Family

## 2013-03-11 ENCOUNTER — Other Ambulatory Visit: Payer: Self-pay | Admitting: Family

## 2013-03-11 ENCOUNTER — Ambulatory Visit (INDEPENDENT_AMBULATORY_CARE_PROVIDER_SITE_OTHER): Payer: Medicare Other | Admitting: General Practice

## 2013-03-11 ENCOUNTER — Encounter: Payer: Self-pay | Admitting: Family

## 2013-03-11 ENCOUNTER — Ambulatory Visit (INDEPENDENT_AMBULATORY_CARE_PROVIDER_SITE_OTHER)
Admission: RE | Admit: 2013-03-11 | Discharge: 2013-03-11 | Disposition: A | Discharge status: Home / Self Care | Payer: Medicare Other | Source: Ambulatory Visit | Attending: Family | Admitting: Family

## 2013-03-11 VITALS — BP 140/82 | HR 89 | Temp 97.3°F | Ht 74.0 in | Wt 256.0 lb

## 2013-03-11 DIAGNOSIS — J209 Acute bronchitis, unspecified: Secondary | ICD-10-CM

## 2013-03-11 DIAGNOSIS — I4891 Unspecified atrial fibrillation: Secondary | ICD-10-CM

## 2013-03-11 DIAGNOSIS — R0602 Shortness of breath: Secondary | ICD-10-CM

## 2013-03-11 DIAGNOSIS — I517 Cardiomegaly: Secondary | ICD-10-CM

## 2013-03-11 DIAGNOSIS — Z79899 Other long term (current) drug therapy: Secondary | ICD-10-CM

## 2013-03-11 DIAGNOSIS — J8410 Pulmonary fibrosis, unspecified: Secondary | ICD-10-CM

## 2013-03-11 LAB — POCT INR: INR: 2.3

## 2013-03-11 MED ORDER — DOXYCYCLINE HYCLATE 100 MG PO TABS: 100.0000 mg | ORAL_TABLET | Freq: Two times a day (BID) | ORAL | Status: DC

## 2013-03-11 MED ORDER — ALBUTEROL SULFATE (2.5 MG/3ML) 0.083% IN NEBU
2.5000 mg | INHALATION_SOLUTION | Freq: Once | RESPIRATORY_TRACT | Status: AC
  Administered 2013-03-11: 2.5 mg via RESPIRATORY_TRACT

## 2013-03-11 MED ORDER — PREDNISONE 20 MG PO TABS: ORAL_TABLET | ORAL | Status: DC

## 2013-03-11 NOTE — Patient Instructions (Signed)

## 2013-03-11 NOTE — Progress Notes (Signed)
Pre visit review using our clinic review tool, if applicable. No additional management support is needed unless otherwise documented below in the visit note. 

## 2013-03-11 NOTE — Progress Notes (Signed)
Subjective:    Patient ID: Billy Casey, male    DOB: 06-Aug-1940, 73 y.o.   MRN: [id number]  HPI 73 year old white male, nonsmoker, is in today with complaints of cough, shortness of breath, wheezing ongoing x1 month. Was treated 2 weeks ago with prednisone and symptoms were better but later returned. Has a productive cough with yellow phlegm. Has been febrile around 101. No over-the-counter medication for relief.   Review of Systems  Constitutional: Negative.   HENT: Positive for congestion. Negative for rhinorrhea, sinus pressure and sneezing.   Respiratory: Positive for cough.   Cardiovascular: Negative.   Musculoskeletal: Negative.   Skin: Negative.   Allergic/Immunologic: Negative.   Neurological: Negative.   Psychiatric/Behavioral: Negative.    Past Medical History  Diagnosis Date  . COPD (chronic obstructive pulmonary disease)   . CHF (congestive heart failure)   . Hypertension   . Atrial fibrillation     History   Social History  . Marital Status: Divorced    Spouse Name: N/A    Number of Children: N/A  . Years of Education: N/A   Occupational History  . Not on file.   Social History Main Topics  . Smoking status: Never Smoker   . Smokeless tobacco: Not on file  . Alcohol Use: No  . Drug Use: No  . Sexual Activity: Not on file   Other Topics Concern  . Not on file   Social History Narrative  . No narrative on file    Past Surgical History  Procedure Laterality Date  . Cholecystectomy    . Permanent pacemaker insertion      Family History  Problem Relation Age of Onset  . Stroke Mother   . CAD Father     Allergies  Allergen Reactions  . Penicillins     Current Outpatient Prescriptions on File Prior to Visit  Medication Sig Dispense Refill  . bumetanide (BUMEX) 2 MG tablet Take 2 mg by mouth daily.      . bumetanide (BUMEX) 2 MG tablet Take 2 mg by mouth daily as needed. May take additional 2 mg for an increase of 3 lbs. (Take extra  potassium, with extra Bumex dose)      . esomeprazole (NEXIUM) 40 MG capsule Take 40 mg by mouth daily before breakfast.      . hydrALAZINE (APRESOLINE) 25 MG tablet Take 25 mg by mouth 3 (three) times daily.      Marland Kitchen HYDROcodone-acetaminophen (NORCO) 10-325 MG per tablet Take 1 tablet by mouth every 8 (eight) hours as needed.  30 tablet  0  . ipratropium-albuterol (DUONEB) 0.5-2.5 (3) MG/3ML SOLN Take 3 mLs by nebulization every 4 (four) hours as needed (shortness of breath or wheeze).      . magnesium hydroxide (MILK OF MAGNESIA) 400 MG/5ML suspension Take 30 mLs by mouth daily as needed for constipation (for constipation with oxycodone).      . metoprolol succinate (TOPROL-XL) 50 MG 24 hr tablet Take 50 mg by mouth 2 (two) times daily. Take with or immediately following a meal.      . oxyCODONE (OXYCONTIN) 10 MG 12 hr tablet Take 10 mg by mouth every 4 (four) hours as needed for pain.      . potassium chloride SA (K-DUR,KLOR-CON) 20 MEQ tablet Take 40 mEq by mouth daily.      . sertraline (ZOLOFT) 50 MG tablet Take 1 tablet (50 mg total) by mouth daily.  90 tablet  0  . spironolactone (ALDACTONE)  25 MG tablet Take 25 mg by mouth daily.      Marland Kitchen. warfarin (COUMADIN) 5 MG tablet Take as directed by anticoagulation clinic  50 tablet  2   No current facility-administered medications on file prior to visit.    BP 140/82  Pulse 89  Temp(Src) 97.3 F (36.3 C)  Ht 6\' 2"  (1.88 m)  Wt 256 lb (116.121 kg)  BMI 32.85 kg/m2  SpO2 92%chart    Objective:   Physical Exam  Constitutional: He is oriented to person, place, and time. He appears well-developed and well-nourished.  HENT:  Right Ear: External ear normal.  Left Ear: External ear normal.  Nose: Nose normal.  Mouth/Throat: Oropharynx is clear and moist.  Neck: Normal range of motion. Neck supple.  Cardiovascular: Normal rate, regular rhythm and normal heart sounds.   Pulmonary/Chest: Effort normal and breath sounds normal.  Musculoskeletal:  Normal range of motion.  Neurological: He is alert and oriented to person, place, and time.  Skin: Skin is warm and dry.  Psychiatric: He has a normal mood and affect.          Assessment & Plan:  Billy Casey was seen today for shortness of breath, cough and insomnia.  Diagnoses and associated orders for this visit:  Acute bronchitis  SOB (shortness of breath) - albuterol (PROVENTIL) (2.5 MG/3ML) 0.083% nebulizer solution 2.5 mg; Take 3 mLs (2.5 mg total) by nebulization once. - DG Chest 2 View; Future  High risk medication use  Other Orders - predniSONE (DELTASONE) 20 MG tablet; 60mg  PO qam x 3 days, 40mg  po qam x 3 days, 20mg  qam x 3 days - doxycycline (VIBRA-TABS) 100 MG tablet; Take 1 tablet (100 mg total) by mouth 2 (two) times daily.    Call with any questions or concerns. Chest x-ray will notify patient of results. Recheck INR in 1 week.

## 2013-03-13 ENCOUNTER — Other Ambulatory Visit: Payer: Self-pay

## 2013-03-13 MED ORDER — BUMETANIDE 2 MG PO TABS: 2.0000 mg | ORAL_TABLET | Freq: Every day | ORAL | Status: DC

## 2013-03-14 ENCOUNTER — Ambulatory Visit: Payer: Medicare Other

## 2013-03-18 ENCOUNTER — Ambulatory Visit (INDEPENDENT_AMBULATORY_CARE_PROVIDER_SITE_OTHER): Payer: Medicare Other | Admitting: Cardiovascular Disease

## 2013-03-18 ENCOUNTER — Ambulatory Visit (INDEPENDENT_AMBULATORY_CARE_PROVIDER_SITE_OTHER): Payer: Medicare Other | Admitting: Family

## 2013-03-18 ENCOUNTER — Encounter: Payer: Self-pay | Admitting: Cardiovascular Disease

## 2013-03-18 VITALS — BP 118/80 | HR 84 | Ht 72.0 in | Wt 254.0 lb

## 2013-03-18 DIAGNOSIS — Z5181 Encounter for therapeutic drug level monitoring: Secondary | ICD-10-CM

## 2013-03-18 DIAGNOSIS — I4891 Unspecified atrial fibrillation: Secondary | ICD-10-CM

## 2013-03-18 DIAGNOSIS — R0989 Other specified symptoms and signs involving the circulatory and respiratory systems: Secondary | ICD-10-CM

## 2013-03-18 DIAGNOSIS — R0609 Other forms of dyspnea: Secondary | ICD-10-CM

## 2013-03-18 DIAGNOSIS — Z79899 Other long term (current) drug therapy: Secondary | ICD-10-CM

## 2013-03-18 DIAGNOSIS — R0789 Other chest pain: Secondary | ICD-10-CM

## 2013-03-18 DIAGNOSIS — E785 Hyperlipidemia, unspecified: Secondary | ICD-10-CM

## 2013-03-18 DIAGNOSIS — R079 Chest pain, unspecified: Secondary | ICD-10-CM

## 2013-03-18 DIAGNOSIS — I10 Essential (primary) hypertension: Secondary | ICD-10-CM

## 2013-03-18 DIAGNOSIS — R0602 Shortness of breath: Secondary | ICD-10-CM

## 2013-03-18 LAB — POCT INR: INR: 2.4

## 2013-03-18 NOTE — Assessment & Plan Note (Signed)
That is remote permanent transvenous pacemaker insertion. He is on Coumadin anticoagulation followed by his primary care physician. His pacemaker has not been checked in years. We will have him see Dr. Royann Shiversroitoru in our pacemaker clinic.

## 2013-03-18 NOTE — Patient Instructions (Signed)
Continue taking the 1 1/2 tablets daily.  Re-check in 3 weeks.  Anticoagulation Dose Instructions as of 03/18/2013     Billy SmilesSun Mon Tue Wed Thu Fri Sat   New Dose 7.5 mg 7.5 mg 7.5 mg 7.5 mg 7.5 mg 7.5 mg 7.5 mg    Description       Continue taking the 1 1/2 tablets daily.  Re-check in 3 weeks.

## 2013-03-18 NOTE — Progress Notes (Signed)
Pre visit review using our clinic review tool, if applicable. No additional management support is needed unless otherwise documented below in the visit note. 

## 2013-03-18 NOTE — Patient Instructions (Signed)
  Your physician wants you to follow-up with him after the tests                                            and with Dr Royann Shiversroitoru in 1 month for your pacemaker                    You will receive a reminder letter in the mail one month in advance. If you don't receive a letter, please call our office to schedule the follow-up appointment.   Your physician recommends that you return for lab work in: 1-2 weeks    Your physician has ordered the following tests: echocardiogram and myoview stress test

## 2013-03-18 NOTE — Assessment & Plan Note (Signed)
The patient has episodes of atypical chest pain that occurred fairly frequently. He also has severe dyspnea on exertion and orthopnea. There is a history of interstitial lung disease and he has a appointment to see a pulmonologist. I'm going to get a 2-D echocardiogram for LV function as well as a pharmacologic Myoview stress test to rule out an ischemic etiology.

## 2013-03-18 NOTE — Progress Notes (Signed)
03/18/2013 Billy Casey   [birth year]  [id number]  Primary Physician CAMPBELL, Sherran Needs, FNP Primary Cardiologist: Runell Gess MD Roseanne Reno   HPI:  Mr. Billy Casey is a 73 year old moderately overweight widowed Caucasian male father of one daughter who is present today during the interview, grand father to 2 grandchildren. He was referred by [name] for evaluation of cardiomegaly. His history is remarkable for hypertension and question of congestive heart failure. He has what appears to be chronic atrial fibrillation and has a permanent transvenous pacemaker implanted remotely which has not been checked recently. He also has a history of interstitial lung disease. He complains of very frequent chest pain which is somewhat atypical for severe dyspnea on exertion and orthopnea. He is aware of salt restriction.   Current Outpatient Prescriptions  Medication Sig Dispense Refill  . bumetanide (BUMEX) 2 MG tablet Take 1 tablet (2 mg total) by mouth daily.  90 tablet  0  . doxycycline (VIBRA-TABS) 100 MG tablet Take 1 tablet (100 mg total) by mouth 2 (two) times daily.  20 tablet  0  . esomeprazole (NEXIUM) 40 MG capsule Take 40 mg by mouth daily before breakfast.      . hydrALAZINE (APRESOLINE) 25 MG tablet Take 25 mg by mouth 3 (three) times daily.      Marland Kitchen ipratropium-albuterol (DUONEB) 0.5-2.5 (3) MG/3ML SOLN Take 3 mLs by nebulization every 4 (four) hours as needed (shortness of breath or wheeze).      . magnesium hydroxide (MILK OF MAGNESIA) 400 MG/5ML suspension Take 30 mLs by mouth daily as needed for constipation (for constipation with oxycodone).      . metoprolol succinate (TOPROL-XL) 50 MG 24 hr tablet Take 50 mg by mouth 2 (two) times daily. Take with or immediately following a meal.      . potassium chloride SA (K-DUR,KLOR-CON) 20 MEQ tablet Take 40 mEq by mouth daily.      . predniSONE (DELTASONE) 20 MG tablet 60mg  PO qam x 3 days, 40mg  po qam x 3  days, 20mg  qam x 3 days  18 tablet  0  . sertraline (ZOLOFT) 50 MG tablet Take 1 tablet (50 mg total) by mouth daily.  90 tablet  0  . warfarin (COUMADIN) 5 MG tablet Take as directed by anticoagulation clinic  50 tablet  2   No current facility-administered medications for this visit.    Allergies  Allergen Reactions  . Penicillins     History   Social History  . Marital Status: Divorced    Spouse Name: N/A    Number of Children: N/A  . Years of Education: N/A   Occupational History  . Not on file.   Social History Main Topics  . Smoking status: Former Games developer  . Smokeless tobacco: Current User    Types: Chew  . Alcohol Use: No  . Drug Use: No  . Sexual Activity: Not on file   Other Topics Concern  . Not on file   Social History Narrative  . No narrative on file     Review of Systems: General: negative for chills, fever, night sweats or weight changes.  Cardiovascular: negative for chest pain, dyspnea on exertion, edema, orthopnea, palpitations, paroxysmal nocturnal dyspnea or shortness of breath Dermatological: negative for rash Respiratory: negative for cough or wheezing Urologic: negative for hematuria Abdominal: negative for nausea, vomiting, diarrhea, bright red blood per rectum, melena, or hematemesis Neurologic: negative for visual changes, syncope, or dizziness All  other systems reviewed and are otherwise negative except as noted above.    Blood pressure 118/80, pulse 84, height 6' (1.829 m), weight 115.214 kg (254 lb), SpO2 93.00%.  General appearance: alert and no distress Neck: no adenopathy, no carotid bruit, no JVD, supple, symmetrical, trachea midline and thyroid not enlarged, symmetric, no tenderness/mass/nodules Lungs: basilar Velcro crackles Heart: regular rate and rhythm, S1, S2 normal, no murmur, click, rub or gallop Abdomen: soft, non-tender; bowel sounds normal; no masses,  no organomegaly Extremities: 22+ left, 1+ right lower extremity  pitting edema. Absent pedal pulses  EKG ventricular paced rhythm at 84  ASSESSMENT AND PLAN:   Atrial fibrillation with RVR That is remote permanent transvenous pacemaker insertion. He is on Coumadin anticoagulation followed by his primary care physician. His pacemaker has not been checked in years. We will have him see Dr. Royann Shiversroitoru in our pacemaker clinic.  Chest pain, atypical/chronic The patient has episodes of atypical chest pain that occurred fairly frequently. He also has severe dyspnea on exertion and orthopnea. There is a history of interstitial lung disease and he has a appointment to see a pulmonologist. I'm going to get a 2-D echocardiogram for LV function as well as a pharmacologic Myoview stress test to rule out an ischemic etiology.      Runell GessJonathan J. Ranveer Wahlstrom MD FACP,FACC,FAHA, [facility] 03/18/2013 3:46 PM

## 2013-03-20 ENCOUNTER — Ambulatory Visit (INDEPENDENT_AMBULATORY_CARE_PROVIDER_SITE_OTHER): Payer: Medicare Other | Admitting: Internal Medicine

## 2013-03-20 ENCOUNTER — Encounter: Payer: Self-pay | Admitting: Internal Medicine

## 2013-03-20 VITALS — BP 110/64 | HR 60 | Temp 98.0°F | Ht 74.0 in | Wt 252.0 lb

## 2013-03-20 DIAGNOSIS — R0609 Other forms of dyspnea: Secondary | ICD-10-CM

## 2013-03-20 DIAGNOSIS — J9600 Acute respiratory failure, unspecified whether with hypoxia or hypercapnia: Secondary | ICD-10-CM

## 2013-03-20 DIAGNOSIS — R0600 Dyspnea, unspecified: Secondary | ICD-10-CM

## 2013-03-20 DIAGNOSIS — R0902 Hypoxemia: Secondary | ICD-10-CM

## 2013-03-20 DIAGNOSIS — J8410 Pulmonary fibrosis, unspecified: Secondary | ICD-10-CM | POA: Insufficient documentation

## 2013-03-20 DIAGNOSIS — R0989 Other specified symptoms and signs involving the circulatory and respiratory systems: Secondary | ICD-10-CM

## 2013-03-20 DIAGNOSIS — J9601 Acute respiratory failure with hypoxia: Secondary | ICD-10-CM

## 2013-03-20 NOTE — Progress Notes (Signed)
   Subjective:    Patient ID: Billy Casey, male    DOB: 08/10/1940   MRN: [id number]  HPI  [id number] yowm quit smoking in the 1970s with no trouble at all until [facility] pac[facility] 2012 and since then gradually more sob and dry cough referred to pulmonary clinic 03/20/2013 by Adline MangoPadonda Campbell for ? PF.    03/20/2013 1st [facility] office visit/ Billy Casey  Chief Complaint  Patient presents with  . Pulmonary Consult    Pt c/o SOB for the past 1-2 yrs. He feels that this has been progressively worse. He states gets SOB walking from room to room at home. He also c/o occ non prod cough.   no better on prednisone ? Dx of [place] in [place]- onset of sob insidious but progressive.   No obvious other patterns in day to day or daytime variabilty or assoc   cp or chest tightness, subjective wheeze overt sinus or hb symptoms. No unusual exp hx or h/o childhood pna/ asthma or knowledge of premature birth.  Sleeping ok without nocturnal  or early am exacerbation  of respiratory  c/o's or need for noct saba. Also denies any obvious fluctuation of symptoms with weather or environmental changes or other aggravating or alleviating factors except as outlined above   Current Medications, Allergies, Complete Past Medical History, Past Surgical History, Family History, and Social History were reviewed in [facility] electronic medical record.           Review of Systems  Constitutional: Negative for fever, chills, activity change, appetite change and unexpected weight change.  HENT: Negative for congestion, dental problem, postnasal drip, rhinorrhea, sneezing, sore throat, trouble swallowing and voice change.   Eyes: Negative for visual disturbance.  Respiratory: Positive for cough and shortness of breath. Negative for choking.   Cardiovascular: Positive for chest pain. Negative for leg swelling.  Gastrointestinal: Negative for nausea, vomiting and abdominal pain.  Genitourinary: Negative for  difficulty urinating.  Musculoskeletal: Positive for arthralgias.  Skin: Negative for rash.  Psychiatric/Behavioral: Negative for behavioral problems and confusion.       Objective:   Physical Exam  Frail amb wf very shaky on details of medica care   Wt Readings from Last 3 Encounters:  03/20/13 252 lb (114.306 kg)  03/18/13 254 lb (115.214 kg)  03/11/13 256 lb (116.121 kg)      HEENT: nl dentition, turbinates, and orophanx. Nl external ear canals without cough reflex   NECK :  without JVD/Nodes/TM/ nl carotid upstrokes bilaterally   LUNGS: no acc muscle use, bilateral insp crackles on half up bilaterally    CV:  RRR  no s3 or murmur or increase in P2, no edema   ABD:  soft and nontender with nl excursion in the supine position. No bruits or organomegaly, bowel sounds nl  MS:  warm without deformities, calf tenderness, cyanosis - No clubbing - no RA changes   SKIN: warm and dry without lesions    NEURO:  alert, approp, no deficits     cxr 03/11/13  1. Persistent severe interstitial lung disease. Although these  changes are most likely related chronic interstitial fibrosis active  pneumonitis cannot be excluded.  2. Severe cardiomegaly. A component of congestive heart failure and  interstitial edema should be considered as well.     Assessment & Plan:

## 2013-03-20 NOTE — Patient Instructions (Addendum)
Use duoneb as needed for breathing   Wear 02 at 2lpm whenever walking more than room to room at home    Continue nexium 40 mg Take 30-60 min before first meal of the day and take pepcid ac 20 mg at bedtime  GERD (REFLUX)  is an extremely common cause of respiratory symptoms, many times with no significant heartburn at all.    It can be treated with medication, but also with lifestyle changes including avoidance of late meals, excessive alcohol, smoking cessation, and avoid fatty foods, chocolate, peppermint, colas, red wine, and acidic juices such as orange juice.  NO MINT OR MENTHOL PRODUCTS SO NO COUGH DROPS  USE SUGARLESS CANDY INSTEAD (jolley ranchers or Stover's)  NO OIL BASED VITAMINS - use powdered substitutes.   Call your primary doctor in [place] and give us the full names and fax numbers of heart doctor and arthritis doctor you were seeing   Please remember to go to the lab and x-ray department downstairs for your tests - we will call you with the results when they are available.     Please schedule a follow up office visit in 4 weeks, sooner if needed with chest xrays and lung function tests

## 2013-03-22 ENCOUNTER — Encounter: Payer: Self-pay | Admitting: Internal Medicine

## 2013-03-22 ENCOUNTER — Telehealth: Payer: Self-pay | Admitting: *Deleted

## 2013-03-22 DIAGNOSIS — R0902 Hypoxemia: Secondary | ICD-10-CM | POA: Insufficient documentation

## 2013-03-22 NOTE — Telephone Encounter (Signed)
I called and spoke with the pt's daughter  She states that she will bring the pt by next wk for labs She states that she will also bring list of his other docs

## 2013-03-22 NOTE — Telephone Encounter (Signed)
Message copied by Christen ButterASKIN, Doreen Garretson M on Fri Mar 22, 2013 10:17 AM ------      Message from: Sandrea HughsWERT, MICHAEL B      Created: Fri Mar 22, 2013  6:12 AM       It does not appear he went to the lab nor has he called us with the names of docs as requested - see if he can complete these tasks by the end of this week ------

## 2013-03-22 NOTE — Assessment & Plan Note (Signed)
Patient Saturations on Room Air at Rest = 94% >  while Ambulating = 88% Patient Saturations on 2Liters of oxygen while Ambulating = 94%  Will provide for now with 02 just with activity > room to room

## 2013-03-22 NOTE — Assessment & Plan Note (Signed)
DDx for pulmonary fibrosis  includes idiopathic pulmonary fibrosis, pulmonary fibrosis associated with rheumatologic diseases (which have a relatively benign course in most cases) , adverse effect from  drugs such as chemotherapy or amiodarone exposure, nonspecific interstitial pneumonia which is typically steroid responsive, and chronic hypersensitivity pneumonitis.   In active  smokers Langerhan's Cell  Histiocyctosis (eosinophilic granuomatosis),  DIP,  and Respiratory Bronchiolitis ILD also need to be considered,    Most likely this is PF in setting of RA and needs re-eval before starting back on steroids or alternative rx but would like to look at previous w/u including cards notes to be sure he wasn't on amio at the time of his pacemaker placement since he traces the onset of sob to this event.

## 2013-03-27 ENCOUNTER — Emergency Department (HOSPITAL_COMMUNITY): Payer: Medicare Other

## 2013-03-27 ENCOUNTER — Inpatient Hospital Stay (HOSPITAL_COMMUNITY): Admission: RE | Admit: 2013-03-27 | Payer: Medicare Other | Source: Ambulatory Visit

## 2013-03-27 ENCOUNTER — Inpatient Hospital Stay (HOSPITAL_COMMUNITY): Payer: Medicare Other

## 2013-03-27 ENCOUNTER — Inpatient Hospital Stay (HOSPITAL_COMMUNITY)
Admission: EM | Admit: 2013-03-27 | Discharge: 2013-04-05 | DRG: 085 | Disposition: A | Discharge status: Hospice | Payer: Medicare Other | Attending: Pulmonary Disease | Admitting: Pulmonary Disease

## 2013-03-27 ENCOUNTER — Encounter (HOSPITAL_COMMUNITY): Payer: Self-pay | Admitting: Emergency Medicine

## 2013-03-27 DIAGNOSIS — R791 Abnormal coagulation profile: Secondary | ICD-10-CM | POA: Diagnosis present

## 2013-03-27 DIAGNOSIS — E871 Hypo-osmolality and hyponatremia: Secondary | ICD-10-CM

## 2013-03-27 DIAGNOSIS — I4891 Unspecified atrial fibrillation: Secondary | ICD-10-CM

## 2013-03-27 DIAGNOSIS — E876 Hypokalemia: Secondary | ICD-10-CM | POA: Diagnosis present

## 2013-03-27 DIAGNOSIS — G9340 Encephalopathy, unspecified: Secondary | ICD-10-CM | POA: Diagnosis present

## 2013-03-27 DIAGNOSIS — J8410 Pulmonary fibrosis, unspecified: Secondary | ICD-10-CM | POA: Diagnosis present

## 2013-03-27 DIAGNOSIS — I4729 Other ventricular tachycardia: Secondary | ICD-10-CM

## 2013-03-27 DIAGNOSIS — R0600 Dyspnea, unspecified: Secondary | ICD-10-CM

## 2013-03-27 DIAGNOSIS — I472 Ventricular tachycardia: Secondary | ICD-10-CM

## 2013-03-27 DIAGNOSIS — J449 Chronic obstructive pulmonary disease, unspecified: Secondary | ICD-10-CM

## 2013-03-27 DIAGNOSIS — T398X5A Adverse effect of other nonopioid analgesics and antipyretics, not elsewhere classified, initial encounter: Secondary | ICD-10-CM | POA: Diagnosis not present

## 2013-03-27 DIAGNOSIS — D6832 Hemorrhagic disorder due to extrinsic circulating anticoagulants: Secondary | ICD-10-CM

## 2013-03-27 DIAGNOSIS — W19XXXA Unspecified fall, initial encounter: Secondary | ICD-10-CM | POA: Diagnosis present

## 2013-03-27 DIAGNOSIS — R0789 Other chest pain: Secondary | ICD-10-CM

## 2013-03-27 DIAGNOSIS — K219 Gastro-esophageal reflux disease without esophagitis: Secondary | ICD-10-CM | POA: Diagnosis present

## 2013-03-27 DIAGNOSIS — J9610 Chronic respiratory failure, unspecified whether with hypoxia or hypercapnia: Secondary | ICD-10-CM

## 2013-03-27 DIAGNOSIS — I609 Nontraumatic subarachnoid hemorrhage, unspecified: Secondary | ICD-10-CM | POA: Diagnosis present

## 2013-03-27 DIAGNOSIS — J849 Interstitial pulmonary disease, unspecified: Secondary | ICD-10-CM

## 2013-03-27 DIAGNOSIS — T45515A Adverse effect of anticoagulants, initial encounter: Secondary | ICD-10-CM | POA: Diagnosis present

## 2013-03-27 DIAGNOSIS — Z5181 Encounter for therapeutic drug level monitoring: Secondary | ICD-10-CM

## 2013-03-27 DIAGNOSIS — Z880 Allergy status to penicillin: Secondary | ICD-10-CM

## 2013-03-27 DIAGNOSIS — S065X0A Traumatic subdural hemorrhage without loss of consciousness, initial encounter: Principal | ICD-10-CM | POA: Diagnosis present

## 2013-03-27 DIAGNOSIS — Z7901 Long term (current) use of anticoagulants: Secondary | ICD-10-CM

## 2013-03-27 DIAGNOSIS — M25529 Pain in unspecified elbow: Secondary | ICD-10-CM | POA: Diagnosis present

## 2013-03-27 DIAGNOSIS — Z950 Presence of cardiac pacemaker: Secondary | ICD-10-CM

## 2013-03-27 DIAGNOSIS — I5032 Chronic diastolic (congestive) heart failure: Secondary | ICD-10-CM

## 2013-03-27 DIAGNOSIS — T3995XA Adverse effect of unspecified nonopioid analgesic, antipyretic and antirheumatic, initial encounter: Secondary | ICD-10-CM

## 2013-03-27 DIAGNOSIS — Z823 Family history of stroke: Secondary | ICD-10-CM

## 2013-03-27 DIAGNOSIS — K5900 Constipation, unspecified: Secondary | ICD-10-CM | POA: Diagnosis not present

## 2013-03-27 DIAGNOSIS — Z87891 Personal history of nicotine dependence: Secondary | ICD-10-CM

## 2013-03-27 DIAGNOSIS — J181 Lobar pneumonia, unspecified organism: Secondary | ICD-10-CM

## 2013-03-27 DIAGNOSIS — I509 Heart failure, unspecified: Secondary | ICD-10-CM | POA: Diagnosis present

## 2013-03-27 DIAGNOSIS — M545 Low back pain, unspecified: Secondary | ICD-10-CM | POA: Diagnosis present

## 2013-03-27 DIAGNOSIS — D689 Coagulation defect, unspecified: Secondary | ICD-10-CM

## 2013-03-27 DIAGNOSIS — S065XAA Traumatic subdural hemorrhage with loss of consciousness status unknown, initial encounter: Secondary | ICD-10-CM | POA: Diagnosis present

## 2013-03-27 DIAGNOSIS — J189 Pneumonia, unspecified organism: Secondary | ICD-10-CM

## 2013-03-27 DIAGNOSIS — Z803 Family history of malignant neoplasm of breast: Secondary | ICD-10-CM

## 2013-03-27 DIAGNOSIS — I5033 Acute on chronic diastolic (congestive) heart failure: Secondary | ICD-10-CM | POA: Diagnosis present

## 2013-03-27 DIAGNOSIS — I10 Essential (primary) hypertension: Secondary | ICD-10-CM | POA: Diagnosis present

## 2013-03-27 DIAGNOSIS — G8929 Other chronic pain: Secondary | ICD-10-CM | POA: Diagnosis present

## 2013-03-27 DIAGNOSIS — S065X9A Traumatic subdural hemorrhage with loss of consciousness of unspecified duration, initial encounter: Secondary | ICD-10-CM | POA: Diagnosis present

## 2013-03-27 DIAGNOSIS — D649 Anemia, unspecified: Secondary | ICD-10-CM | POA: Diagnosis present

## 2013-03-27 DIAGNOSIS — R0902 Hypoxemia: Secondary | ICD-10-CM

## 2013-03-27 DIAGNOSIS — Z8249 Family history of ischemic heart disease and other diseases of the circulatory system: Secondary | ICD-10-CM

## 2013-03-27 DIAGNOSIS — J9601 Acute respiratory failure with hypoxia: Secondary | ICD-10-CM

## 2013-03-27 DIAGNOSIS — J4489 Other specified chronic obstructive pulmonary disease: Secondary | ICD-10-CM | POA: Diagnosis present

## 2013-03-27 DIAGNOSIS — Z79899 Other long term (current) drug therapy: Secondary | ICD-10-CM

## 2013-03-27 LAB — BASIC METABOLIC PANEL
BUN: 15 mg/dL (ref 6–23)
CALCIUM: 9.3 mg/dL (ref 8.4–10.5)
CHLORIDE: 94 meq/L — AB (ref 96–112)
CO2: 29 meq/L (ref 19–32)
Creatinine, Ser: 0.74 mg/dL (ref 0.50–1.35)
GFR calc Af Amer: >90 mL/min (ref >90)
GFR calc non Af Amer: >90 mL/min (ref >90)
Glucose, Bld: 97 mg/dL (ref 70–99)
Potassium: 3.6 mEq/L — ABNORMAL LOW (ref 3.7–5.3)
SODIUM: 137 meq/L (ref 137–147)

## 2013-03-27 LAB — CBC WITH DIFFERENTIAL/PLATELET
Basophils Absolute: 0.1 10*3/uL (ref 0.0–0.1)
Basophils Relative: 1 % (ref 0–1)
Eosinophils Absolute: 0.3 10*3/uL (ref 0.0–0.7)
Eosinophils Relative: 2 % (ref 0–5)
HCT: 37.6 % — ABNORMAL LOW (ref 39.0–52.0)
Hemoglobin: 12.3 g/dL — ABNORMAL LOW (ref 13.0–17.0)
LYMPHS ABS: 1.3 10*3/uL (ref 0.7–4.0)
LYMPHS PCT: 12 % (ref 12–46)
MCH: 32.3 pg (ref 26.0–34.0)
MCHC: 32.7 g/dL (ref 30.0–36.0)
MCV: 98.7 fL (ref 78.0–100.0)
Monocytes Absolute: 0.9 10*3/uL (ref 0.1–1.0)
Monocytes Relative: 8 % (ref 3–12)
NEUTROS PCT: 78 % — AB (ref 43–77)
Neutro Abs: 8.6 10*3/uL — ABNORMAL HIGH (ref 1.7–7.7)
Platelets: 197 10*3/uL (ref 150–400)
RBC: 3.81 MIL/uL — AB (ref 4.22–5.81)
RDW: 16.5 % — ABNORMAL HIGH (ref 11.5–15.5)
WBC: 11 10*3/uL — AB (ref 4.0–10.5)

## 2013-03-27 LAB — TROPONIN I

## 2013-03-27 LAB — PRO B NATRIURETIC PEPTIDE: PRO B NATRI PEPTIDE: 2511 pg/mL — AB (ref 0–125)

## 2013-03-27 LAB — PROTIME-INR
INR: 1.7 — ABNORMAL HIGH (ref 0.00–1.49)
PROTHROMBIN TIME: 19.5 s — AB (ref 11.6–15.2)

## 2013-03-27 LAB — ABO/RH: ABO/RH(D): O NEG

## 2013-03-27 LAB — MRSA PCR SCREENING: MRSA by PCR: POSITIVE — AB

## 2013-03-27 MED ORDER — FUROSEMIDE 10 MG/ML IJ SOLN
40.0000 mg | Freq: Once | INTRAMUSCULAR | Status: AC
  Administered 2013-03-27: 40 mg via INTRAVENOUS
  Filled 2013-03-27: qty 4

## 2013-03-27 MED ORDER — PANTOPRAZOLE SODIUM 40 MG PO TBEC
40.0000 mg | DELAYED_RELEASE_TABLET | Freq: Every day | ORAL | Status: DC
  Administered 2013-03-28 – 2013-04-01 (×5): 40 mg via ORAL
  Filled 2013-03-27 (×5): qty 1

## 2013-03-27 MED ORDER — SODIUM CHLORIDE 0.9 % IV SOLN: 250.0000 mL | INTRAVENOUS | Status: DC | PRN

## 2013-03-27 MED ORDER — IPRATROPIUM-ALBUTEROL 0.5-2.5 (3) MG/3ML IN SOLN
3.0000 mL | Freq: Four times a day (QID) | RESPIRATORY_TRACT | Status: DC
  Administered 2013-03-27 (×2): 3 mL via RESPIRATORY_TRACT
  Filled 2013-03-27 (×2): qty 3

## 2013-03-27 MED ORDER — SODIUM CHLORIDE 0.9 % IV SOLN
INTRAVENOUS | Status: DC
  Administered 2013-03-27: 1 mL via INTRAVENOUS
  Administered 2013-03-30: 15:00:00 via INTRAVENOUS

## 2013-03-27 MED ORDER — ACETAMINOPHEN 325 MG PO TABS
650.0000 mg | ORAL_TABLET | Freq: Once | ORAL | Status: AC
  Administered 2013-03-27: 650 mg via ORAL
  Filled 2013-03-27: qty 2

## 2013-03-27 MED ORDER — FENTANYL CITRATE 0.05 MG/ML IJ SOLN
25.0000 ug | INTRAMUSCULAR | Status: DC | PRN
  Administered 2013-03-27 (×2): 25 ug via INTRAVENOUS
  Administered 2013-03-27 – 2013-03-30 (×13): 50 ug via INTRAVENOUS
  Administered 2013-03-31: 25 ug via INTRAVENOUS
  Administered 2013-03-31 (×2): 50 ug via INTRAVENOUS
  Filled 2013-03-27 (×21): qty 2

## 2013-03-27 MED ORDER — VITAMIN K1 10 MG/ML IJ SOLN
10.0000 mg | Freq: Once | INTRAVENOUS | Status: AC
  Administered 2013-03-27: 10 mg via INTRAVENOUS
  Filled 2013-03-27: qty 1

## 2013-03-27 MED ORDER — SERTRALINE HCL 50 MG PO TABS
50.0000 mg | ORAL_TABLET | Freq: Every day | ORAL | Status: DC
  Administered 2013-03-28 – 2013-04-05 (×9): 50 mg via ORAL
  Filled 2013-03-27 (×9): qty 1

## 2013-03-27 MED ORDER — POTASSIUM CHLORIDE CRYS ER 20 MEQ PO TBCR
40.0000 meq | EXTENDED_RELEASE_TABLET | Freq: Once | ORAL | Status: DC
  Filled 2013-03-27: qty 2

## 2013-03-27 MED ORDER — METOPROLOL SUCCINATE ER 25 MG PO TB24
25.0000 mg | ORAL_TABLET | Freq: Two times a day (BID) | ORAL | Status: DC
  Administered 2013-03-28 – 2013-04-01 (×10): 25 mg via ORAL
  Filled 2013-03-27 (×13): qty 1

## 2013-03-27 NOTE — ED Provider Notes (Signed)
CSN: [id number]     Arrival date & time 03/27/13  1022 History   First MD Initiated Contact with Patient 03/27/13 1037     Chief Complaint  Patient presents with  . Fall  . Near Syncope     (Consider location/radiation/quality/duration/timing/severity/associated sxs/prior Treatment) HPI Comments: 73 year old male who presents after a fall. He states that he was walking to his car, was reaching for the handle, and fell. He is unsure why he fell. His daughter was with him, and reports that he did not pass out and did not lose consciousness.  She stood him up, handed him his walker, at which point he fell backwards again and struck the back of his head. He did not lose consciousness this time either. He denies associated symptoms.  However, he is unsure what caused him to fall either episode.  Patient is a 73 y.o. male presenting with fall.  Fall This is a new problem. The current episode started 1 to 2 hours ago. Episode frequency: twice. The problem has been resolved. Pertinent negatives include no chest pain, no abdominal pain, no headaches and no shortness of breath. Nothing aggravates the symptoms. Nothing relieves the symptoms.    Past Medical History  Diagnosis Date  . COPD (chronic obstructive pulmonary disease)   . CHF (congestive heart failure)   . Hypertension   . Atrial fibrillation   . Interstitial lung disease   . Pacemaker   . Chest pain   . Dyspnea on exertion    Past Surgical History  Procedure Laterality Date  . Cholecystectomy    . Permanent pacemaker insertion     Family History  Problem Relation Age of Onset  . Stroke Mother   . CAD Father   . Breast cancer Mother    History  Substance Use Topics  . Smoking status: Former Smoker -- 0.50 packs/day for 3 years    Types: Cigarettes    Quit date: 01/17/1977  . Smokeless tobacco: Current User    Types: Chew  . Alcohol Use: Yes    Review of Systems  Constitutional: Negative for fever.  HENT: Negative  for congestion.   Respiratory: Negative for cough and shortness of breath.   Cardiovascular: Positive for near-syncope. Negative for chest pain.  Gastrointestinal: Negative for nausea, vomiting, abdominal pain and diarrhea.  Neurological: Negative for headaches.  All other systems reviewed and are negative.      Allergies  Penicillins  Home Medications   Current Outpatient Rx  Name  Route  Sig  Dispense  Refill  . bumetanide (BUMEX) 2 MG tablet   Oral   Take 1 tablet (2 mg total) by mouth daily.   90 tablet   0   . doxycycline (VIBRA-TABS) 100 MG tablet   Oral   Take 1 tablet (100 mg total) by mouth 2 (two) times daily.   20 tablet   0   . esomeprazole (NEXIUM) 40 MG capsule   Oral   Take 40 mg by mouth daily before breakfast.         . hydrALAZINE (APRESOLINE) 25 MG tablet   Oral   Take 25 mg by mouth 3 (three) times daily.         Marland Kitchen ipratropium-albuterol (DUONEB) 0.5-2.5 (3) MG/3ML SOLN   Nebulization   Take 3 mLs by nebulization every 4 (four) hours as needed (shortness of breath or wheeze).         . magnesium hydroxide (MILK OF MAGNESIA) 400 MG/5ML suspension  Oral   Take 30 mLs by mouth daily as needed for constipation (for constipation with oxycodone).         . metoprolol succinate (TOPROL-XL) 50 MG 24 hr tablet   Oral   Take 50 mg by mouth 2 (two) times daily. Take with or immediately following a meal.         . potassium chloride SA (K-DUR,KLOR-CON) 20 MEQ tablet   Oral   Take 40 mEq by mouth daily.         . sertraline (ZOLOFT) 50 MG tablet   Oral   Take 1 tablet (50 mg total) by mouth daily.   90 tablet   0   . warfarin (COUMADIN) 5 MG tablet      Take as directed by anticoagulation clinic   50 tablet   2     30 day    BP 119/56  Pulse 57  Temp(Src) 97.5 F (36.4 C) (Oral)  Resp 22  SpO2 99% Physical Exam  Nursing note and vitals reviewed. Constitutional: He is oriented to person, place, and time. He appears  well-developed and well-nourished. No distress.  HENT:  Head: Normocephalic and atraumatic.  Mouth/Throat: Oropharynx is clear and moist.  Eyes: Conjunctivae are normal. Pupils are equal, round, and reactive to light. No scleral icterus.  Neck: Neck supple.  Cardiovascular: Normal rate, regular rhythm, normal heart sounds and intact distal pulses.  Frequent extrasystoles are present.  No murmur heard. Pulmonary/Chest: Effort normal and breath sounds normal. No stridor. No respiratory distress. He has no wheezes. He has no rales.  Abdominal: Soft. He exhibits no distension. There is no tenderness.  Musculoskeletal: Normal range of motion. He exhibits edema (L>R (reported chronic.)).  Neurological: He is alert and oriented to person, place, and time.  Skin: Skin is warm and dry. No rash noted.  Psychiatric: He has a normal mood and affect. His behavior is normal.    ED Course  CRITICAL CARE Performed by: Blake Divine DAVID III Authorized by: Blake Divine DAVID III Total critical care time: 40 minutes Critical care time was exclusive of separately billable procedures and treating other patients. Critical care was necessary to treat or prevent imminent or life-threatening deterioration of the following conditions: CNS failure or compromise. Critical care was time spent personally by me on the following activities: development of treatment plan with patient or surrogate, discussions with consultants, evaluation of patient's response to treatment, examination of patient, obtaining history from patient or surrogate, ordering and performing treatments and interventions, ordering and review of laboratory studies, ordering and review of radiographic studies, pulse oximetry, re-evaluation of patient's condition and review of old charts.   (including critical care time) Labs Review All labs drawn in ED reviewed.   Imaging Review Dg Chest 1 View  03/27/2013   CLINICAL DATA Short of breath  EXAM  CHEST - 1 VIEW  COMPARISON DG CHEST 2 VIEW dated 03/11/2013; CT CHEST W/CM dated 07/03/2012  FINDINGS There are bilateral patchy interstitial and alveolar airspace opacities more confluent in the right upper lobe. There is no pleural effusion or pneumothorax. Stable cardiomegaly. Single lead cardiac pacer. Unremarkable osseous structures.  IMPRESSION Bilateral patchy interstitial and alveolar airspace opacities more confluent in the right upper lobe. There is likely an element of underlying chronic interstitial lung disease. The appearance is concerning for superimposed mild interstitial edema or atypical infection.  SIGNATURE  Electronically Signed   By: Elige Ko   On: 03/27/2013 12:34   Dg Lumbar Spine  Complete  03/27/2013   CLINICAL DATA Fall.  History of L1 fracture  EXAM LUMBAR SPINE - COMPLETE 4+ VIEW  COMPARISON None.  FINDINGS Moderate to severe fracture of L1 which is most likely chronic, specially given the history supporting this.  Mild depression of the superior endplate of L3 which could be due to a recent or chronic fracture.  Disc degeneration and mild spurring L4-5 and L5-S1. No pars defect or mass.  IMPRESSION Moderate to severe compression fracture of L1 which appears chronic  Mild fracture of L3 which could be acute or chronic. MRI would be helpful to evaluate for bone marrow edema and acute fracture.  SIGNATURE  Electronically Signed   By: Marlan Palauharles  Clark M.D.   On: 03/27/2013 14:53   Ct Head Wo Contrast  03/27/2013   CLINICAL DATA Patient fell walking to the car and can't remember the fall, no loss of consciousness reported by a witness, dizziness  EXAM CT HEAD WITHOUT CONTRAST  TECHNIQUE Contiguous axial images were obtained from the base of the skull through the vertex without intravenous contrast.  COMPARISON None.  FINDINGS No skull fracture. There is a moderate scalp hematoma posteriorly over the left parieto-occipital region. There is mild to moderate diffuse atrophy and mild low  attenuation in the deep white matter. No evidence of vascular territory infarct. There is a 9 mm oval focus of hyperattenuation in the falx centrally above the level of the ventricles consistent with a tiny subdural hematoma. There is no other evidence of extra-axial fluid and there is no parenchymal hematoma identified. There is no hydrocephalus.  IMPRESSION 9 mm focal subdural hematoma. Critical Value/emergent results were called by telephone at the time of interpretation on 03/27/2013 at 2:33 PM to Dr. Blake DivineJOHN Marvel Sapp , who verbally acknowledged these results.  SIGNATURE  Electronically Signed   By: Esperanza Heiraymond  Rubner M.D.   On: 03/27/2013 14:33  All radiology studies independently viewed by me.      EKG Interpretation   Date/Time:  Wednesday March 27 2013 10:24:38 EDT Ventricular Rate:  75 PR Interval:    QRS Duration: 155 QT Interval:  438 QTC Calculation: 489 R Axis:   -79 Text Interpretation:  Atrial fibrillation Nonspecific IVCD with LAD Left  ventricular hypertrophy Anterior infarct, old No significant change was  found Confirmed by [facility]  MD, TREY ([id number]) on 03/27/2013 10:45:33 AM      MDM   Final diagnoses:  None     73 year old male who presents after 2 back-to-back falls. He has a contusion to the back of his head and complains of low back pain.  He does not know what caused the fall.  However, I am highly suspicious that he became hypoxic. When I stood him up for gait testing, he became profoundly dyspneic and O2 sats drifted to mid 80s on his chronic home O2 settings.    Workup demonstrated small subdural hematoma.  Discussed with Dr. Bettina GaviaNoodleman ([facility]) who will evaluate.  Given Vit K and FFP for reversal of coumadin.    Admitted to ICU for close monitoring and neuro checks.     Candyce ChurnJohn David Kellyn Mccary III, MD 03/28/13 [phone]

## 2013-03-27 NOTE — ED Notes (Signed)
Cleaned PT's L knee scrape with saline H2so4 solution, bacetracin, dry 4x4 and paper tape

## 2013-03-27 NOTE — ED Notes (Signed)
Gave report to WESCO InternationalDebbie RN. PT had become agitated from HA 8/10 and back pain. PT thought he had lost control of his bowels and when we went to change him noticed unilateral scrotal edema (~size of an orange) and priaprism. PT reports unable to urinate.

## 2013-03-27 NOTE — ED Notes (Signed)
Asked patient if he feels better after bipap placed.  Stated slight better and will attempt to lay supine for CT scan.

## 2013-03-27 NOTE — ED Notes (Signed)
Patient's family member stated that he had already urinated.  Will check again later.

## 2013-03-27 NOTE — ED Notes (Signed)
PT reports " I feel like I'm drunk..the bed feels like its moving and the room is spinning" - MD notified

## 2013-03-27 NOTE — ED Notes (Signed)
Called Respiratory and let them know that MD ordering bipap for PT in hopes he can tolerate CT scan

## 2013-03-27 NOTE — ED Notes (Signed)
Stood patient immediately became short of breath and unsteady while standing. Sat down and laid in bed.  Took 3 minutes for shortness of breath to resolve. EDP notified.

## 2013-03-27 NOTE — Consult Note (Signed)
Reason for Consult:  Head injury Referring Physician:  Dr. Serita Grit, III, MD  Billy Casey is an 73 y.o. right-handed male.  HPI: Patient is a debilitated elderly male who fell twice this morning when trying to get into his car. He was with his daughter, heading to a pulmonary medicine appointment with Dr. Christinia Gully.  He apparently first fell forward landing on his hands and knees, and his daughter got the rolling walker, and once he was up, he then fell backwards striking the back of his head. He and his daughter explain that he did not suffer loss of consciousness with either fall. However because of having struck the back of his head with the second fall, she called EMS, and the patient was brought to the [facility] emergency room.  The patient has numerous significant medical comorbidities, including COPD (he becomes short of breath talking), congestive heart failure, atrial fibrillation for which he is on Coumadin, hypertension, and he has a pacemaker. He is followed by Dr. Cher Nakai from [facility] her care, Dr. Christinia Gully from [facility] pulmonary, and Dr. Quay Burow from cardiology.  Symptomatically the patient has some headache, but denies diplopia, blurry vision, weakness, seizures, or other neurologic difficulty.  Patient was worked up by Dr. Doy Mince with CT of the brain without contrast that revealed a small amount of blood over the midportion of the corpus callosum, adjacent to the inferior aspect of the falx cerebri. There is no associated mass effect or shift. Neurosurgical consultation was requested for further recommendations.  When I spoke with Dr. Doy Mince at about 1500 I recommended reversal of the patient's Coumadin with 2 units of fresh frozen plasma and vitamin K. I also explained that the patient certainly will need observation in the ICU for couple of days, with a followup CT scan of the brain without contrast on the morning of Friday, March 13. 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  Past Medical History:  Past Medical History  Diagnosis Date  . COPD (chronic obstructive pulmonary disease)   . CHF (congestive heart failure)   . Hypertension   . Atrial fibrillation   . Interstitial lung disease   . Pacemaker   . Chest pain   . Dyspnea on exertion     Past Surgical History:  Past Surgical History  Procedure Laterality Date  . Cholecystectomy    . Permanent pacemaker insertion      Family History:  Family History  Problem Relation Age of Onset  . Stroke Mother   . CAD Father   . Breast cancer Mother     Social History:  reports that he quit smoking about 36 years ago. His smoking use included Cigarettes. He has a 1.5 pack-year smoking history. His smokeless tobacco use includes Chew. He reports that he drinks alcohol. He reports that he does not use illicit drugs.  Allergies:  Allergies  Allergen Reactions  . Penicillins Other (See Comments)    Reaction unknown    Medications: I have reviewed the patient's current medications.  ROS:  Notable for those difficulties described in his history of present illness and his past medical history, he does have chronic shortness of breath.  Physical Examination: Elderly white male, who becomes easily short of breath, while talking, but in no acute distress. Blood pressure 117/67, pulse 68, temperature  45 F (36.7 C), temperature source Oral, resp. rate 26, SpO2 94.00%.  Neurological Examination: Mental Status Examination:  Awake and alert, oriented to his name, [facility], hospital, and March 2015. Speech is fluent, he has good comprehension. He follows commands. Cranial Nerve Examination:  Pupils are equal round and reactive to light, and about 2 mm bilaterally. EOMI. Facial movements symmetrical. Hearing present  bilaterally. Palatal movement symmetrical. Shoulder shrug symmetrical. Tongue is midline. Motor Examination:  Good strength in all 4 extremities, with no drift of the upper extremities. Sensory Examination:  Intact to pinprick throughout. Reflex Examination:   Symmetrical. Gait and Stance Examination:  Not tested due to the nature the patient's condition.   Results for orders placed during the hospital encounter of 03/27/13 (from the past 48 hour(s))  CBC WITH DIFFERENTIAL     Status: Abnormal   Collection Time    03/27/13 10:28 AM      Result Value Ref Range   WBC 11.0 (*) 4.0 - 10.5 K/uL   RBC 3.81 (*) 4.22 - 5.81 MIL/uL   Hemoglobin 12.3 (*) 13.0 - 17.0 g/dL   HCT 37.6 (*) 39.0 - 52.0 %   MCV 98.7  78.0 - 100.0 fL   MCH 32.3  26.0 - 34.0 pg   MCHC 32.7  30.0 - 36.0 g/dL   RDW 16.5 (*) 11.5 - 15.5 %   Platelets 197  150 - 400 K/uL   Neutrophils Relative % 78 (*) 43 - 77 %   Neutro Abs 8.6 (*) 1.7 - 7.7 K/uL   Lymphocytes Relative 12  12 - 46 %   Lymphs Abs 1.3  0.7 - 4.0 K/uL   Monocytes Relative 8  3 - 12 %   Monocytes Absolute 0.9  0.1 - 1.0 K/uL   Eosinophils Relative 2  0 - 5 %   Eosinophils Absolute 0.3  0.0 - 0.7 K/uL   Basophils Relative 1  0 - 1 %   Basophils Absolute 0.1  0.0 - 0.1 K/uL  BASIC METABOLIC PANEL     Status: Abnormal   Collection Time    03/27/13 10:28 AM      Result Value Ref Range   Sodium 137  137 - 147 mEq/L   Potassium 3.6 (*) 3.7 - 5.3 mEq/L   Chloride 94 (*) 96 - 112 mEq/L   CO2 29  19 - 32 mEq/L   Glucose, Bld 97  70 - 99 mg/dL   BUN 15  6 - 23 mg/dL   Creatinine, Ser 0.74  0.50 - 1.35 mg/dL   Calcium 9.3  8.4 - 10.5 mg/dL   GFR calc non Af Amer >90  >90 mL/min   GFR calc Af Amer >90  >90 mL/min   Comment: (NOTE)     The eGFR has been calculated using the CKD EPI equation.     This calculation has not been validated in all clinical situations.     eGFR's persistently <90 mL/min signify possible Chronic Kidney     Disease.  TROPONIN I      Status: None   Collection Time    03/27/13 10:28 AM      Result Value Ref Range   Troponin I <0.30  <0.30 ng/mL   Comment:            Due to the release kinetics of cTnI,     a negative result within the first hours     of the onset of symptoms does not rule out  myocardial infarction with certainty.     If myocardial infarction is still suspected,     repeat the test at appropriate intervals.  PROTIME-INR     Status: Abnormal   Collection Time    03/27/13 10:28 AM      Result Value Ref Range   Prothrombin Time 19.5 (*) 11.6 - 15.2 seconds   INR 1.70 (*) 0.00 - 1.49  PRO B NATRIURETIC PEPTIDE     Status: Abnormal   Collection Time    03/27/13 10:28 AM      Result Value Ref Range   Pro B Natriuretic peptide (BNP) 2511.0 (*) 0 - 125 pg/mL  ABO/RH     Status: None   Collection Time    03/27/13 10:28 AM      Result Value Ref Range   ABO/RH(D) O NEG    PREPARE FRESH FROZEN PLASMA     Status: None   Collection Time    03/27/13  4:00 PM      Result Value Ref Range   Unit Number [id number]     Blood Component Type THAWED PLASMA     Unit division 00     Status of Unit ISSUED     Transfusion Status OK TO TRANSFUSE     Unit Number [id number]     Blood Component Type THAWED PLASMA     Unit division 00     Status of Unit ISSUED     Transfusion Status OK TO TRANSFUSE      Dg Chest 1 View  03/27/2013   CLINICAL DATA Short of breath  EXAM CHEST - 1 VIEW  COMPARISON DG CHEST 2 VIEW dated 03/11/2013; CT CHEST W/CM dated 07/03/2012  FINDINGS There are bilateral patchy interstitial and alveolar airspace opacities more confluent in the right upper lobe. There is no pleural effusion or pneumothorax. Stable cardiomegaly. Single lead cardiac pacer. Unremarkable osseous structures.  IMPRESSION Bilateral patchy interstitial and alveolar airspace opacities more confluent in the right upper lobe. There is likely an element of underlying chronic interstitial lung disease. The appearance is  concerning for superimposed mild interstitial edema or atypical infection.  SIGNATURE  Electronically Signed   By: Kathreen Devoid   On: 03/27/2013 12:34   Dg Lumbar Spine Complete  03/27/2013   CLINICAL DATA Fall.  History of L1 fracture  EXAM LUMBAR SPINE - COMPLETE 4+ VIEW  COMPARISON None.  FINDINGS Moderate to severe fracture of L1 which is most likely chronic, specially given the history supporting this.  Mild depression of the superior endplate of L3 which could be due to a recent or chronic fracture.  Disc degeneration and mild spurring L4-5 and L5-S1. No pars defect or mass.  IMPRESSION Moderate to severe compression fracture of L1 which appears chronic  Mild fracture of L3 which could be acute or chronic. MRI would be helpful to evaluate for bone marrow edema and acute fracture.  SIGNATURE  Electronically Signed   By: Franchot Gallo M.D.   On: 03/27/2013 14:53   Ct Head Wo Contrast  03/27/2013   CLINICAL DATA Patient fell walking to the car and can't remember the fall, no loss of consciousness reported by a witness, dizziness  EXAM CT HEAD WITHOUT CONTRAST  TECHNIQUE Contiguous axial images were obtained from the base of the skull through the vertex without intravenous contrast.  COMPARISON None.  FINDINGS No skull fracture. There is a moderate scalp hematoma posteriorly over the left parieto-occipital region. There is mild to moderate diffuse atrophy  and mild low attenuation in the deep white matter. No evidence of vascular territory infarct. There is a 9 mm oval focus of hyperattenuation in the falx centrally above the level of the ventricles consistent with a tiny subdural hematoma. There is no other evidence of extra-axial fluid and there is no parenchymal hematoma identified. There is no hydrocephalus.  IMPRESSION 9 mm focal subdural hematoma. Critical Value/emergent results were called by telephone at the time of interpretation on 03/27/2013 at 2:33 PM to Dr. Serita Grit , who verbally  acknowledged these results.  SIGNATURE  Electronically Signed   By: Skipper Cliche M.D.   On: 03/27/2013 14:33     Assessment/Plan: Patient with multiple significant medical comorbidities including COPD, CHF, atrial fibrillation, hypertension, and a permanent pacemaker, who fell at home earlier today, but did not suffer loss or causes. Neurologically he has no complaints, but CT the brain did reveal a small area of hemorrhage deep in the brain. Notably the patient is on Coumadin, with an INR of 1.7. The Coumadin is being reversed with vitamin K and fresh frozen plasma. Patient is being admitted to the critical care medicine service, and I will follow is a neurosurgical consultant. He will need a followup CT the brain on the morning of Friday, March 13.  I have had an opportunity discuss the case with Dr. Lavonna Monarch, from [facility], and we discussed my assessment and recommendations.  Billy Spangle, MD 03/27/2013, 4:28 PM

## 2013-03-27 NOTE — ED Notes (Signed)
Respiratory and Nurse transporting patient to CT.

## 2013-03-27 NOTE — ED Notes (Signed)
Went to CT and XRAY with PT. [id number]

## 2013-03-27 NOTE — ED Notes (Signed)
PT afebrile and no signs of reaction to platelets present

## 2013-03-27 NOTE — ED Notes (Signed)
Spoke to MD about PT pain. Verbal order given for PO Tylenol 650mg . PT reports he would be unable to lie flat for CT scan even with face mask.

## 2013-03-27 NOTE — ED Notes (Signed)
PT afebrile. No reaction to FFP noted.

## 2013-03-27 NOTE — ED Notes (Signed)
PT fell walking to the car and can't remember the fall. EMS reports that daughter was with PT and reports no LOC. PT A&0 x3, even unlabored respirations, 2 l [place], NAD

## 2013-03-27 NOTE — H&P (Signed)
PULMONARY / CRITICAL CARE MEDICINE   Name: Billy Casey MRN: [id number] DOB: 11-04-1940    ADMISSION DATE:  03/27/2013  REFERRING MD :  Newell CoralNudelman  PRIMARY SERVICE: PCCM  CHIEF COMPLAINT:  SDH  BRIEF PATIENT DESCRIPTION: 73 yo male with hx COPD, CHF, AF on Coumadin presented 3/11 with small SDH after a fall.  Seen by Neurosurgery >>> medical management.  PCCM is asked to admit.  SIGNIFICANT EVENTS / STUDIES:  3/11 CT head >>> 9 mm focal subdural hematoma  LINES / TUBES:  CULTURES:  ANTIBIOTICS:  HISTORY OF PRESENT ILLNESS:  73 yo male with hx COPD, AFib on coumadin, CHF who sustained mechanical fall 3/11, hitting the back of his head.  CT revealed small SDH.  Pt is awake and oriented, c/o headache and back pain.  Denies LOC, blurry vision, lightheadedness, chest pain.  Does c/o mild SOB above baseline.  PAST MEDICAL HISTORY :  Past Medical History  Diagnosis Date  . COPD (chronic obstructive pulmonary disease)   . CHF (congestive heart failure)   . Hypertension   . Atrial fibrillation   . Interstitial lung disease   . Pacemaker   . Chest pain   . Dyspnea on exertion    Past Surgical History  Procedure Laterality Date  . Cholecystectomy    . Permanent pacemaker insertion     Prior to Admission medications   Medication Sig Start Date End Date Taking? Authorizing Provider  bumetanide (BUMEX) 2 MG tablet Take 1 tablet (2 mg total) by mouth daily. 03/13/13  Yes Baker PieriniPadonda B Campbell, FNP  esomeprazole (NEXIUM) 40 MG capsule Take 40 mg by mouth daily before breakfast.   Yes Historical Provider, MD  hydrALAZINE (APRESOLINE) 25 MG tablet Take 25 mg by mouth 3 (three) times daily.   Yes Historical Provider, MD  ipratropium-albuterol (DUONEB) 0.5-2.5 (3) MG/3ML SOLN Take 3 mLs by nebulization every 4 (four) hours as needed (shortness of breath or wheeze).   Yes Historical Provider, MD  metoprolol succinate (TOPROL-XL) 50 MG 24 hr tablet Take 50 mg by mouth 2 (two) times daily. Take  with or immediately following a meal.   Yes Historical Provider, MD  potassium chloride SA (K-DUR,KLOR-CON) 20 MEQ tablet Take 40 mEq by mouth daily.   Yes Historical Provider, MD  sertraline (ZOLOFT) 50 MG tablet Take 1 tablet (50 mg total) by mouth daily. 01/16/13  Yes Baker PieriniPadonda B Campbell, FNP  warfarin (COUMADIN) 5 MG tablet Take 7.5 mg by mouth daily.   Yes Historical Provider, MD   Allergies  Allergen Reactions  . Penicillins Other (See Comments)    Reaction unknown    FAMILY HISTORY:  Family History  Problem Relation Age of Onset  . Stroke Mother   . CAD Father   . Breast cancer Mother    SOCIAL HISTORY:  reports that he quit smoking about 36 years ago. His smoking use included Cigarettes. He has a 1.5 pack-year smoking history. His smokeless tobacco use includes Chew. He reports that he drinks alcohol. He reports that he does not use illicit drugs.  REVIEW OF SYSTEMS:  As per HPI - All other systems reviewed and were neg.   VITAL SIGNS: Temp:  [97.5 F (36.4 C)-98 F (36.7 C)] 98 F (36.7 C) (03/11 1335) Pulse Rate:  [57-82] 68 (03/11 1510) Resp:  [18-38] 26 (03/11 1510) BP: (105-134)/(52-95) 117/67 mmHg (03/11 1510) SpO2:  [94 %-100 %] 94 % (03/11 1510)  HEMODYNAMICS:   VENTILATOR SETTINGS:   INTAKE / OUTPUT:  Intake/Output   None     PHYSICAL EXAMINATION: General:  Chronically ill appearing older male, NAD  Neuro:  Awake, alert, oriented, MAE, appropriate, PERRL HEENT:  Mm moist, no JVD, small laceration to posterior scalp, small amt dried blood Cardiovascular:  s1s2 irreg Lungs:  resps even, mildly labored, NAD, diminished R>L with few scattered crackles Abdomen:  Soft, +bs Musculoskeletal:  Warm and dry, scant BLE edema   LABS:  CBC  Recent Labs Lab 03/27/13 1028  WBC 11.0*  HGB 12.3*  HCT 37.6*  PLT 197   Coag's  Recent Labs Lab 03/27/13 1028  INR 1.70*   BMET  Recent Labs Lab 03/27/13 1028  NA 137  K 3.6*  CL 94*  CO2 29  BUN  15  CREATININE 0.74  GLUCOSE 97   Electrolytes  Recent Labs Lab 03/27/13 1028  CALCIUM 9.3   Sepsis Markers No results found for this basename: LATICACIDVEN, PROCALCITON, O2SATVEN,  in the last 168 hours ABG No results found for this basename: PHART, PCO2ART, PO2ART,  in the last 168 hours Liver Enzymes No results found for this basename: AST, ALT, ALKPHOS, BILITOT, ALBUMIN,  in the last 168 hours Cardiac Enzymes  Recent Labs Lab 03/27/13 1028  TROPONINI <0.30  PROBNP 2511.0*   Glucose No results found for this basename: GLUCAP,  in the last 168 hours  Imaging Dg Chest 1 View  03/27/2013   CLINICAL DATA Short of breath  EXAM CHEST - 1 VIEW  COMPARISON DG CHEST 2 VIEW dated 03/11/2013; CT CHEST W/CM dated 07/03/2012  FINDINGS There are bilateral patchy interstitial and alveolar airspace opacities more confluent in the right upper lobe. There is no pleural effusion or pneumothorax. Stable cardiomegaly. Single lead cardiac pacer. Unremarkable osseous structures.  IMPRESSION Bilateral patchy interstitial and alveolar airspace opacities more confluent in the right upper lobe. There is likely an element of underlying chronic interstitial lung disease. The appearance is concerning for superimposed mild interstitial edema or atypical infection.  SIGNATURE  Electronically Signed   By: Elige Ko   On: 03/27/2013 12:34   Dg Lumbar Spine Complete  03/27/2013   CLINICAL DATA Fall.  History of L1 fracture  EXAM LUMBAR SPINE - COMPLETE 4+ VIEW  COMPARISON None.  FINDINGS Moderate to severe fracture of L1 which is most likely chronic, specially given the history supporting this.  Mild depression of the superior endplate of L3 which could be due to a recent or chronic fracture.  Disc degeneration and mild spurring L4-5 and L5-S1. No pars defect or mass.  IMPRESSION Moderate to severe compression fracture of L1 which appears chronic  Mild fracture of L3 which could be acute or chronic. MRI would be  helpful to evaluate for bone marrow edema and acute fracture.  SIGNATURE  Electronically Signed   By: Marlan Palau M.D.   On: 03/27/2013 14:53   Ct Head Wo Contrast  03/27/2013   CLINICAL DATA Patient fell walking to the car and can't remember the fall, no loss of consciousness reported by a witness, dizziness  EXAM CT HEAD WITHOUT CONTRAST  TECHNIQUE Contiguous axial images were obtained from the base of the skull through the vertex without intravenous contrast.  COMPARISON None.  FINDINGS No skull fracture. There is a moderate scalp hematoma posteriorly over the left parieto-occipital region. There is mild to moderate diffuse atrophy and mild low attenuation in the deep white matter. No evidence of vascular territory infarct. There is a 9 mm oval focus of hyperattenuation in  the falx centrally above the level of the ventricles consistent with a tiny subdural hematoma. There is no other evidence of extra-axial fluid and there is no parenchymal hematoma identified. There is no hydrocephalus.  IMPRESSION 9 mm focal subdural hematoma. Critical Value/emergent results were called by telephone at the time of interpretation on 03/27/2013 at 2:33 PM to Dr. Blake Divine , who verbally acknowledged these results.  SIGNATURE  Electronically Signed   By: Esperanza Heir M.D.   On: 03/27/2013 14:33   ASSESSMENT / PLAN:  PULMONARY A: COPD (Dr. Sherene Sires), no evidence of exacerbation P:   Goal SpO2>92 Supplemental oxygen PRN DuoNebs CXR  CARDIOVASCULAR A: AF (Dr. Allyson Sabal) Chronic diastolic CHF, no evidence of exacerbation HTN but transiently hypotensive upon initial presentation P:  Goal BP < 140/80 unless specified differently by Neurosurgery Toprol 25 ( half preadmission dose ) Hold Hydralazine  RENAL A: Hypokalemia - mild P:   Trend BMP Hold Bumex K 40 x 1  GASTROINTESTINAL A: GERD  P:   NPO for now Continue home Nexium   HEMATOLOGIC A: Coumadin coagulopathy Mild anemia VTE Px P:  Hold  Coumadin Trend CBC / INR Given Vit K / FFP  SCDs  INFECTIOUS A: No active issues  P:   No intervention required  ENDOCRINE A: No active issues  P:   No intervention required  NEUROLOGIC A: SDH, small  Acute encephalopathy P:   Neurosurgery following  WHITEHEART,KATHRYN, NP 03/27/2013  4:31 PM Pager: [phone] or [phone]  I have personally obtained history, examined patient, evaluated and interpreted laboratory and imaging results, reviewed medical records, formulated assessment / plan and placed orders.  CRITICAL CARE:  The patient is critically ill with multiple organ systems failure and requires high complexity decision making for assessment and support, frequent evaluation and titration of therapies, application of advanced monitoring technologies and extensive interpretation of multiple databases. Critical Care Time devoted to patient care services described in this note is 45 minutes.   Lonia Farber, MD Pulmonary and Critical Care Medicine [facility] Pager: [phone]  03/27/2013, 5:10 PM

## 2013-03-27 NOTE — ED Notes (Signed)
Spoke with Loretha StaplerWofford, MD and collaborated about how to get PT to tolerate CT scans. MD ordering bi-pap for PT in hopes he will be able to tolerate CT.

## 2013-03-27 NOTE — ED Notes (Signed)
Respiratory @ bedside. RN placed gauze padding on head lac to protect from strap of bipap

## 2013-03-28 ENCOUNTER — Inpatient Hospital Stay (HOSPITAL_COMMUNITY): Payer: Medicare Other

## 2013-03-28 DIAGNOSIS — I4891 Unspecified atrial fibrillation: Secondary | ICD-10-CM

## 2013-03-28 DIAGNOSIS — J9600 Acute respiratory failure, unspecified whether with hypoxia or hypercapnia: Secondary | ICD-10-CM

## 2013-03-28 LAB — PREPARE FRESH FROZEN PLASMA
UNIT DIVISION: 0
Unit division: 0

## 2013-03-28 LAB — URINALYSIS, ROUTINE W REFLEX MICROSCOPIC
GLUCOSE, UA: NEGATIVE mg/dL
Hgb urine dipstick: NEGATIVE
Ketones, ur: NEGATIVE mg/dL
LEUKOCYTES UA: NEGATIVE
Nitrite: NEGATIVE
PH: 6 (ref 5.0–8.0)
Protein, ur: NEGATIVE mg/dL
SPECIFIC GRAVITY, URINE: 1.023 (ref 1.005–1.030)
Urobilinogen, UA: 4 mg/dL — ABNORMAL HIGH (ref 0.0–1.0)

## 2013-03-28 LAB — PROTIME-INR
INR: 1.36 (ref 0.00–1.49)
PROTHROMBIN TIME: 16.4 s — AB (ref 11.6–15.2)

## 2013-03-28 LAB — CBC
HCT: 33.3 % — ABNORMAL LOW (ref 39.0–52.0)
Hemoglobin: 11.1 g/dL — ABNORMAL LOW (ref 13.0–17.0)
MCH: 32.6 pg (ref 26.0–34.0)
MCHC: 33.3 g/dL (ref 30.0–36.0)
MCV: 97.9 fL (ref 78.0–100.0)
Platelets: 144 10*3/uL — ABNORMAL LOW (ref 150–400)
RBC: 3.4 MIL/uL — AB (ref 4.22–5.81)
RDW: 16.5 % — ABNORMAL HIGH (ref 11.5–15.5)
WBC: 8 10*3/uL (ref 4.0–10.5)

## 2013-03-28 LAB — BASIC METABOLIC PANEL
BUN: 15 mg/dL (ref 6–23)
CHLORIDE: 97 meq/L (ref 96–112)
CO2: 28 meq/L (ref 19–32)
CREATININE: 0.79 mg/dL (ref 0.50–1.35)
Calcium: 8.6 mg/dL (ref 8.4–10.5)
GFR calc Af Amer: >90 mL/min (ref >90)
GFR calc non Af Amer: 88 mL/min — ABNORMAL LOW (ref >90)
Glucose, Bld: 115 mg/dL — ABNORMAL HIGH (ref 70–99)
Potassium: 3.7 mEq/L (ref 3.7–5.3)
Sodium: 139 mEq/L (ref 137–147)

## 2013-03-28 LAB — PHOSPHORUS: Phosphorus: 3 mg/dL (ref 2.3–4.6)

## 2013-03-28 LAB — PRO B NATRIURETIC PEPTIDE: PRO B NATRI PEPTIDE: 4057 pg/mL — AB (ref 0–125)

## 2013-03-28 LAB — MAGNESIUM: MAGNESIUM: 1.9 mg/dL (ref 1.5–2.5)

## 2013-03-28 MED ORDER — HYDROCODONE-ACETAMINOPHEN 7.5-325 MG/15ML PO SOLN
10.0000 mL | Freq: Four times a day (QID) | ORAL | Status: DC | PRN
  Administered 2013-03-28 – 2013-04-01 (×13): 10 mL via ORAL
  Filled 2013-03-28 (×15): qty 15

## 2013-03-28 MED ORDER — HYDROCODONE-HOMATROPINE 5-1.5 MG/5ML PO SYRP
5.0000 mL | ORAL_SOLUTION | Freq: Four times a day (QID) | ORAL | Status: DC | PRN
  Administered 2013-03-30: 5 mL via ORAL
  Filled 2013-03-28: qty 5

## 2013-03-28 MED ORDER — IPRATROPIUM-ALBUTEROL 0.5-2.5 (3) MG/3ML IN SOLN: 3.0000 mL | Freq: Four times a day (QID) | RESPIRATORY_TRACT | Status: DC | PRN

## 2013-03-28 MED ORDER — IPRATROPIUM-ALBUTEROL 0.5-2.5 (3) MG/3ML IN SOLN
3.0000 mL | Freq: Four times a day (QID) | RESPIRATORY_TRACT | Status: DC
  Administered 2013-03-28 – 2013-04-05 (×32): 3 mL via RESPIRATORY_TRACT
  Filled 2013-03-28 (×9): qty 3
  Filled 2013-03-28: qty 39
  Filled 2013-03-28 (×21): qty 3

## 2013-03-28 NOTE — Progress Notes (Signed)
eLink Physician-Brief Progress Note Patient Name: Billy Casey D Gottlieb DOB: June 29, 1940 MRN: [id number]  Date of Service  03/28/2013   HPI/Events of Note   Coughing spell Shortness of breath Hypoxemia on [place]  eICU Interventions  Facemask O2 Sit up in bed CXR stat   Intervention Category Intermediate Interventions: Respiratory distress - evaluation and management  Jabe Jeanbaptiste 03/28/2013, 5:49 PM

## 2013-03-28 NOTE — Progress Notes (Signed)
PULMONARY / CRITICAL CARE MEDICINE   Name: Billy Casey MRN: [id number] DOB: 1940/08/24    ADMISSION DATE:  03/27/2013  REFERRING MD :  Newell Coral  PRIMARY SERVICE: PCCM  CHIEF COMPLAINT:  SDH  BRIEF PATIENT DESCRIPTION: 73 yo male with hx COPD, CHF, AF on Coumadin presented 3/11 with small SDH after a fall.  Seen by Neurosurgery >>> medical management.  PCCM is asked to admit.  SIGNIFICANT EVENTS / STUDIES:  3/11 CT head >>> 9 mm focal subdural hematoma  LINES / TUBES:  CULTURES:  ANTIBIOTICS:  HISTORY OF PRESENT ILLNESS:  73 yo male with hx COPD, AFib on coumadin, CHF who sustained mechanical fall 3/11, hitting the back of his head.  CT revealed small SDH.  Pt is awake and oriented, c/o headache and back pain.  Denies LOC, blurry vision, lightheadedness, chest pain.  Does c/o mild SOB above baseline.  Subjective:    VITAL SIGNS: Temp:  [97.5 F (36.4 C)-99 F (37.2 C)] 97.8 F (36.6 C) (03/12 0800) Pulse Rate:  [52-91] 75 (03/12 1000) Resp:  [18-38] 23 (03/12 1000) BP: (105-159)/(52-95) 129/83 mmHg (03/12 1000) SpO2:  [85 %-100 %] 93 % (03/12 1000) Weight:  [109.7 kg (241 lb 13.5 oz)-111.8 kg (246 lb 7.6 oz)] 111.8 kg (246 lb 7.6 oz) (03/12 0500)  HEMODYNAMICS:   VENTILATOR SETTINGS:   INTAKE / OUTPUT: Intake/Output     03/11 0701 - 03/12 0700 03/12 0701 - 03/13 0700   I.V. (mL/kg) 110 (1) 30 (0.3)   Blood 12.5    Total Intake(mL/kg) 122.5 (1.1) 30 (0.3)   Urine (mL/kg/hr) 1325    Total Output 1325     Net -1202.5 +30        Stool Occurrence 1 x      PHYSICAL EXAMINATION: General:  Chronically ill appearing older male, NAD  Neuro:  Awake, alert, oriented, MAE, appropriate, PERRL HEENT:  Mm moist, no JVD, small laceration to posterior scalp, small amt dried blood Cardiovascular:  s1s2 irreg Lungs:  resps even, mildly labored, NAD, diminished R>L with few scattered crackles Abdomen:  Soft, +bs Musculoskeletal:  Warm and dry, scant BLE edema    LABS:  CBC  Recent Labs Lab 03/27/13 1028 03/28/13 0202  WBC 11.0* 8.0  HGB 12.3* 11.1*  HCT 37.6* 33.3*  PLT 197 144*   Coag's  Recent Labs Lab 03/27/13 1028 03/28/13 0202  INR 1.70* 1.36   BMET  Recent Labs Lab 03/27/13 1028 03/28/13 0202  NA 137 139  K 3.6* 3.7  CL 94* 97  CO2 29 28  BUN 15 15  CREATININE 0.74 0.79  GLUCOSE 97 115*   Electrolytes  Recent Labs Lab 03/27/13 1028 03/28/13 0202  CALCIUM 9.3 8.6  MG  --  1.9  PHOS  --  3.0   Sepsis Markers No results found for this basename: LATICACIDVEN, PROCALCITON, O2SATVEN,  in the last 168 hours ABG No results found for this basename: PHART, PCO2ART, PO2ART,  in the last 168 hours Liver Enzymes No results found for this basename: AST, ALT, ALKPHOS, BILITOT, ALBUMIN,  in the last 168 hours Cardiac Enzymes  Recent Labs Lab 03/27/13 1028 03/28/13 0202  TROPONINI <0.30  --   PROBNP 2511.0* 4057.0*   Glucose No results found for this basename: GLUCAP,  in the last 168 hours  Imaging Dg Chest 1 View  03/27/2013   CLINICAL DATA Short of breath  EXAM CHEST - 1 VIEW  COMPARISON DG CHEST 2 VIEW dated 03/11/2013; CT CHEST  W/CM dated 07/03/2012  FINDINGS There are bilateral patchy interstitial and alveolar airspace opacities more confluent in the right upper lobe. There is no pleural effusion or pneumothorax. Stable cardiomegaly. Single lead cardiac pacer. Unremarkable osseous structures.  IMPRESSION Bilateral patchy interstitial and alveolar airspace opacities more confluent in the right upper lobe. There is likely an element of underlying chronic interstitial lung disease. The appearance is concerning for superimposed mild interstitial edema or atypical infection.  SIGNATURE  Electronically Signed   By: Elige KoHetal  Patel   On: 03/27/2013 12:34   Dg Lumbar Spine Complete  03/27/2013   CLINICAL DATA Fall.  History of L1 fracture  EXAM LUMBAR SPINE - COMPLETE 4+ VIEW  COMPARISON None.  FINDINGS Moderate to  severe fracture of L1 which is most likely chronic, specially given the history supporting this.  Mild depression of the superior endplate of L3 which could be due to a recent or chronic fracture.  Disc degeneration and mild spurring L4-5 and L5-S1. No pars defect or mass.  IMPRESSION Moderate to severe compression fracture of L1 which appears chronic  Mild fracture of L3 which could be acute or chronic. MRI would be helpful to evaluate for bone marrow edema and acute fracture.  SIGNATURE  Electronically Signed   By: Marlan Palauharles  Clark M.D.   On: 03/27/2013 14:53   Ct Head Wo Contrast  03/27/2013   CLINICAL DATA Patient fell walking to the car and can't remember the fall, no loss of consciousness reported by a witness, dizziness  EXAM CT HEAD WITHOUT CONTRAST  TECHNIQUE Contiguous axial images were obtained from the base of the skull through the vertex without intravenous contrast.  COMPARISON None.  FINDINGS No skull fracture. There is a moderate scalp hematoma posteriorly over the left parieto-occipital region. There is mild to moderate diffuse atrophy and mild low attenuation in the deep white matter. No evidence of vascular territory infarct. There is a 9 mm oval focus of hyperattenuation in the falx centrally above the level of the ventricles consistent with a tiny subdural hematoma. There is no other evidence of extra-axial fluid and there is no parenchymal hematoma identified. There is no hydrocephalus.  IMPRESSION 9 mm focal subdural hematoma. Critical Value/emergent results were called by telephone at the time of interpretation on 03/27/2013 at 2:33 PM to Dr. Blake DivineJOHN WOFFORD , who verbally acknowledged these results.  SIGNATURE  Electronically Signed   By: Esperanza Heiraymond  Rubner M.D.   On: 03/27/2013 14:33   ASSESSMENT / PLAN:  PULMONARY A: COPD (Dr. Sherene SiresWert), no evidence of exacerbation but he does apparently have severe disease + hypoxemia P:   Goal SpO2>92 Supplemental oxygen PRN > likely needs to wear  24x7 DuoNebs Cough suppression 3/12  CARDIOVASCULAR A: AF (Dr. Allyson SabalBerry) Chronic diastolic CHF, no evidence of exacerbation HTN but transiently hypotensive upon initial presentation P:  Goal BP < 140/80 Toprol 25 ( half preadmission dose ) Hold Hydralazine  RENAL A: Hypokalemia - mild P:   Trend BMP Hold Bumex  GASTROINTESTINAL A: GERD  P:   Start diet 3/12 Continue home Nexium   HEMATOLOGIC A: Coumadin coagulopathy Mild anemia VTE Px P:  Hold Coumadin Trend CBC / INR Given Vit K / FFP  SCDs  INFECTIOUS A: No active issues  P:   No intervention required  ENDOCRINE A: No active issues  P:   No intervention required  NEUROLOGIC A: SDH, small  Acute encephalopathy Back pain P:   Repeat Ct scan 3/13 No surgical intervention for now Change  fentanyl to PO pain control   I have personally obtained history, examined patient, evaluated and interpreted laboratory and imaging results, reviewed medical records, formulated assessment / plan and placed orders.  Levy Pupa, MD, PhD 03/28/2013, 10:34 AM [facility] [phone] or if no answer [phone]

## 2013-03-28 NOTE — Progress Notes (Signed)
Subjective: Patient resting in bed, without new complaints.  Objective: Vital signs in last 24 hours: Filed Vitals:   03/28/13 0500 03/28/13 0600 03/28/13 0700 03/28/13 0800  BP: 137/65 135/75 136/56   Pulse: 75 80 52 52  Temp:      TempSrc:      Resp: 36 23 28 28   Height:      Weight: 111.8 kg (246 lb 7.6 oz)     SpO2: 93% 96% 95% 96%    Intake/Output from previous day: 03/11 0701 - 03/12 0700 In: 122.5 [I.V.:110; Blood:12.5] Out: 1325 [Urine:1325] Intake/Output this shift: Total I/O In: 10 [I.V.:10] Out: -   Physical Exam:  Awake and alert, oriented to name, hospital, and March 2015. Following commands. Speech fluent. Pupils equal round reactive to light. EOMI. Facial movement symmetrical. Moving all 4 extremities well. No drift of upper extremities.  CBC  Recent Labs  03/27/13 1028 03/28/13 0202  WBC 11.0* 8.0  HGB 12.3* 11.1*  HCT 37.6* 33.3*  PLT 197 144*   BMET  Recent Labs  03/27/13 1028 03/28/13 0202  NA 137 139  K 3.6* 3.7  CL 94* 97  CO2 29 28  GLUCOSE 97 115*  BUN 15 15  CREATININE 0.74 0.79  CALCIUM 9.3 8.6   Studies/Results: Dg Chest 1 View  03/27/2013   CLINICAL DATA Short of breath  EXAM CHEST - 1 VIEW  COMPARISON DG CHEST 2 VIEW dated 03/11/2013; CT CHEST W/CM dated 07/03/2012  FINDINGS There are bilateral patchy interstitial and alveolar airspace opacities more confluent in the right upper lobe. There is no pleural effusion or pneumothorax. Stable cardiomegaly. Single lead cardiac pacer. Unremarkable osseous structures.  IMPRESSION Bilateral patchy interstitial and alveolar airspace opacities more confluent in the right upper lobe. There is likely an element of underlying chronic interstitial lung disease. The appearance is concerning for superimposed mild interstitial edema or atypical infection.  SIGNATURE  Electronically Signed   By: Elige KoHetal  Patel   On: 03/27/2013 12:34   Dg Lumbar Spine Complete  03/27/2013   CLINICAL DATA Fall.  History of  L1 fracture  EXAM LUMBAR SPINE - COMPLETE 4+ VIEW  COMPARISON None.  FINDINGS Moderate to severe fracture of L1 which is most likely chronic, specially given the history supporting this.  Mild depression of the superior endplate of L3 which could be due to a recent or chronic fracture.  Disc degeneration and mild spurring L4-5 and L5-S1. No pars defect or mass.  IMPRESSION Moderate to severe compression fracture of L1 which appears chronic  Mild fracture of L3 which could be acute or chronic. MRI would be helpful to evaluate for bone marrow edema and acute fracture.  SIGNATURE  Electronically Signed   By: Marlan Palauharles  Clark M.D.   On: 03/27/2013 14:53   Ct Head Wo Contrast  03/27/2013   CLINICAL DATA Patient fell walking to the car and can't remember the fall, no loss of consciousness reported by a witness, dizziness  EXAM CT HEAD WITHOUT CONTRAST  TECHNIQUE Contiguous axial images were obtained from the base of the skull through the vertex without intravenous contrast.  COMPARISON None.  FINDINGS No skull fracture. There is a moderate scalp hematoma posteriorly over the left parieto-occipital region. There is mild to moderate diffuse atrophy and mild low attenuation in the deep white matter. No evidence of vascular territory infarct. There is a 9 mm oval focus of hyperattenuation in the falx centrally above the level of the ventricles consistent with a tiny subdural  hematoma. There is no other evidence of extra-axial fluid and there is no parenchymal hematoma identified. There is no hydrocephalus.  IMPRESSION 9 mm focal subdural hematoma. Critical Value/emergent results were called by telephone at the time of interpretation on 03/27/2013 at 2:33 PM to Dr. Blake Divine , who verbally acknowledged these results.  SIGNATURE  Electronically Signed   By: Esperanza Heir M.D.   On: 03/27/2013 14:33    Assessment/Plan: Stable from a neurosurgical perspective. Will need followup CT of brain without contrast in a.m., and  continued neuro checks in the ICU until CT cleared.   Hewitt Shorts, MD 03/28/2013, 8:16 AM

## 2013-03-29 ENCOUNTER — Inpatient Hospital Stay (HOSPITAL_COMMUNITY): Payer: Medicare Other

## 2013-03-29 DIAGNOSIS — J449 Chronic obstructive pulmonary disease, unspecified: Secondary | ICD-10-CM

## 2013-03-29 LAB — CBC
HCT: 32.7 % — ABNORMAL LOW (ref 39.0–52.0)
Hemoglobin: 10.6 g/dL — ABNORMAL LOW (ref 13.0–17.0)
MCH: 32.2 pg (ref 26.0–34.0)
MCHC: 32.4 g/dL (ref 30.0–36.0)
MCV: 99.4 fL (ref 78.0–100.0)
PLATELETS: 131 10*3/uL — AB (ref 150–400)
RBC: 3.29 MIL/uL — ABNORMAL LOW (ref 4.22–5.81)
RDW: 16.3 % — AB (ref 11.5–15.5)
WBC: 8.1 10*3/uL (ref 4.0–10.5)

## 2013-03-29 LAB — BASIC METABOLIC PANEL
BUN: 13 mg/dL (ref 6–23)
CALCIUM: 8.6 mg/dL (ref 8.4–10.5)
CO2: 28 mEq/L (ref 19–32)
Chloride: 96 mEq/L (ref 96–112)
Creatinine, Ser: 0.73 mg/dL (ref 0.50–1.35)
Glucose, Bld: 96 mg/dL (ref 70–99)
POTASSIUM: 3.4 meq/L — AB (ref 3.7–5.3)
Sodium: 137 mEq/L (ref 137–147)

## 2013-03-29 LAB — BLOOD PRODUCT ORDER (VERBAL) VERIFICATION

## 2013-03-29 MED ORDER — POTASSIUM CHLORIDE CRYS ER 20 MEQ PO TBCR
20.0000 meq | EXTENDED_RELEASE_TABLET | ORAL | Status: AC
  Administered 2013-03-29 (×2): 20 meq via ORAL
  Filled 2013-03-29 (×2): qty 1

## 2013-03-29 NOTE — Progress Notes (Signed)
Subjective: Patient resting in bed, without complaints. CT the brain without contrast this morning shows clearing of minimal traumatic subarachnoid hemorrhage. There is no hematoma, mass effect, or shift. He reports that he is eating well.  Objective: Vital signs in last 24 hours: Filed Vitals:   03/29/13 0500 03/29/13 0600 03/29/13 0700 03/29/13 0746  BP: 111/70 119/68    Pulse: 52 75 73   Temp:    97.7 F (36.5 C)  TempSrc:    Axillary  Resp: 19 22 26    Height:      Weight: 110.4 kg (243 lb 6.2 oz)     SpO2: 97% 97% 99%     Intake/Output from previous day: 03/12 0701 - 03/13 0700 In: 420 [I.V.:420] Out: 1027 [Urine:1025; Stool:2] Intake/Output this shift:    Physical Exam:  Awake alert, oriented to name, Billy Casey, and 2015. Following commands. Speech fluent. Moving all 4 extremities well. Pupils equal round and reactive to light. EOMI.  CBC  Recent Labs  03/28/13 0202 03/29/13 0235  WBC 8.0 8.1  HGB 11.1* 10.6*  HCT 33.3* 32.7*  PLT 144* 131*   BMET  Recent Labs  03/28/13 0202 03/29/13 0235  NA 139 137  K 3.7 3.4*  CL 97 96  CO2 28 28  GLUCOSE 115* 96  BUN 15 13  CREATININE 0.79 0.73  CALCIUM 8.6 8.6   ABG    Component Value Date/Time   PHART 7.415 07/03/2012 0005   PCO2ART 38.2 07/03/2012 0005   PO2ART 129.0* 07/03/2012 0005   HCO3 24.5* 07/03/2012 0005   TCO2 26 07/03/2012 0005   O2SAT 99.0 07/03/2012 0005    Studies/Results: Dg Chest 1 View  03/27/2013   CLINICAL DATA Short of breath  EXAM CHEST - 1 VIEW  COMPARISON DG CHEST 2 VIEW dated 03/11/2013; CT CHEST W/CM dated 07/03/2012  FINDINGS There are bilateral patchy interstitial and alveolar airspace opacities more confluent in the right upper lobe. There is no pleural effusion or pneumothorax. Stable cardiomegaly. Single lead cardiac pacer. Unremarkable osseous structures.  IMPRESSION Bilateral patchy interstitial and alveolar airspace opacities more confluent in the right upper lobe. There is likely  an element of underlying chronic interstitial lung disease. The appearance is concerning for superimposed mild interstitial edema or atypical infection.  SIGNATURE  Electronically Signed   By: Elige Ko   On: 03/27/2013 12:34   Dg Lumbar Spine Complete  03/27/2013   CLINICAL DATA Fall.  History of L1 fracture  EXAM LUMBAR SPINE - COMPLETE 4+ VIEW  COMPARISON None.  FINDINGS Moderate to severe fracture of L1 which is most likely chronic, specially given the history supporting this.  Mild depression of the superior endplate of L3 which could be due to a recent or chronic fracture.  Disc degeneration and mild spurring L4-5 and L5-S1. No pars defect or mass.  IMPRESSION Moderate to severe compression fracture of L1 which appears chronic  Mild fracture of L3 which could be acute or chronic. MRI would be helpful to evaluate for bone marrow edema and acute fracture.  SIGNATURE  Electronically Signed   By: Marlan Palau M.D.   On: 03/27/2013 14:53   Ct Head Wo Contrast  03/29/2013   CLINICAL DATA:  Fall with head trauma and intracranial hemorrhage.  EXAM: CT HEAD WITHOUT CONTRAST  TECHNIQUE: Contiguous axial images were obtained from the base of the skull through the vertex without intravenous contrast.  COMPARISON:  03/27/2013  FINDINGS: The study suffers from considerable motion degradation. Small focus of  subarachnoid hemorrhage at the inferior surface of the falx is no larger, and is probably becoming less distinct. One could question if there is a very minimal (1-2 mm) subdural hematoma along the left posterior parietal convexity. This is difficult to state with certainty given the motion. No large subdural. No additional bleeding is seen. The brain shows mild atrophy and chronic small vessel change as seen previously. Left parietal scalp hematoma appear similar. No skull fracture. No fluid in the sinuses. No hydrocephalus.  IMPRESSION: Significant motion degradation.  Small amount of subarachnoid blood just  inferior to the central falx is becoming less distinct. No increasing subarachnoid blood.  Left parietal scalp hematoma.  Question very thin (1-2 mm) subdural hematoma along the left parietal convexity. Difficult to state with certainty because of motion.   Electronically Signed   By: Paulina FusiMark  Shogry M.D.   On: 03/29/2013 07:48   Ct Head Wo Contrast  03/27/2013   CLINICAL DATA Patient fell walking to the car and can't remember the fall, no loss of consciousness reported by a witness, dizziness  EXAM CT HEAD WITHOUT CONTRAST  TECHNIQUE Contiguous axial images were obtained from the base of the skull through the vertex without intravenous contrast.  COMPARISON None.  FINDINGS No skull fracture. There is a moderate scalp hematoma posteriorly over the left parieto-occipital region. There is mild to moderate diffuse atrophy and mild low attenuation in the deep white matter. No evidence of vascular territory infarct. There is a 9 mm oval focus of hyperattenuation in the falx centrally above the level of the ventricles consistent with a tiny subdural hematoma. There is no other evidence of extra-axial fluid and there is no parenchymal hematoma identified. There is no hydrocephalus.  IMPRESSION 9 mm focal subdural hematoma. Critical Value/emergent results were called by telephone at the time of interpretation on 03/27/2013 at 2:33 PM to Dr. Blake DivineJOHN WOFFORD , who verbally acknowledged these results.  SIGNATURE  Electronically Signed   By: Esperanza Heiraymond  Rubner M.D.   On: 03/27/2013 14:33   Dg Chest Port 1 View  03/28/2013   CLINICAL DATA:  Acute shortness of breath.  EXAM: PORTABLE CHEST - 1 VIEW  COMPARISON:  03/27/2013  FINDINGS: Irregular interstitial and patchy airspace opacities are distributed in a heterogeneous distribution in the lungs, more prominent in the right mid lung and in the left mid and lower lung. Allowing for differences in technique and patient positioning, findings are stable from the previous day's study.  No  convincing pleural effusion.  No pneumothorax.  Cardiac silhouette is mildly enlarged no mediastinal or hilar masses. Single lead pacemaker is stable in well positioned.  IMPRESSION: 1. No significant change from the previous day's study. 2. Bilateral arm opacities described above are mostly due to chronic interstitial disease based on more remote exams. Superimposed infectious or inflammatory infiltrate, however, should be considered likely in the proper clinical setting. No convincing edema.   Electronically Signed   By: Amie Portlandavid  Ormond M.D.   On: 03/28/2013 19:12    Assessment/Plan: Continues to do well neurologically. Minimal traumatic subarachnoid hemorrhage clearing. Stable from a neurosurgical perspective. No need for further neurosurgical followup, that is neither inpatient nor outpatient.   Hewitt ShortsNUDELMAN,ROBERT W, MD 03/29/2013, 7:58 AM

## 2013-03-29 NOTE — Progress Notes (Signed)
[facility] FOR [facility] LAB REPLACEMENT ONLY  The [facility] does [facility] based on the criteria listed below:   1. Is GFR >/= 40 ml/min? yes  Patient's GFR today is >90 2. Is urine output >/= 0.5 ml/kg/hr for the last 6 hours? yes Patient's UOP is 0.5 ml/kg/hr 3. Is BUN < 60 mg/dL? yes  Patient's BUN today is 13 4. Abnormal electrolyte(s):Potassium 5. Ordered repletion with: KCL per Protocol Ryland Smoots P 03/29/2013 4:54 AM

## 2013-03-29 NOTE — Evaluation (Addendum)
Physical Therapy Evaluation Patient Details Name: Billy Casey MRN: [id number] DOB: 07-14-1940 Today's Date: 03/29/2013 Time: 4098-1191 PT Time Calculation (min): 20 min  PT Assessment / Plan / Recommendation History of Present Illness  73 yo male with hx COPD, CHF, AF on Coumadin presented 3/11 with small SDH after a fall.    Clinical Impression  Pt adm due to the above. Presents with limitations in functional mobility secondary to deficits indicated below. Pt limited in evaluation due to SOB and pain. Pt on O2 mask and at 15L O2 during session and pt consistently at 88% O2; respiratory therapist and RN aware. Pt to benefit from skilled PT to address deficits and maximize mobility prior to D/C home with daughter. Pt will require 24/7 (A) upon acute D/C in order to return home. Pt reluctant to ambulate with RW but will need to assess stability of gt with vs without RW.     PT Assessment  Patient needs continued PT services    Follow Up Recommendations  Home health PT;Supervision/Assistance - 24 hour (if daughter is unable to provide 24/7 (A), pt may require SNF)     Does the patient have the potential to tolerate intense rehabilitation      Barriers to Discharge   no family present; pt reports he will have 24/7 (A) upon D/C    Equipment Recommendations  None recommended by PT (pt reports he has a RW)    Recommendations for Other Services OT consult   Frequency Min 4X/week    Precautions / Restrictions Precautions Precautions: Fall Precaution Comments: Pt denies any falls other than the 2 prior to admission Restrictions Weight Bearing Restrictions: No   Pertinent Vitals/Pain Vitals stable t/o session; pt on 15L O2 on O2 mask due to SOB; pt fatigued quickly        Mobility  Bed Mobility Overal bed mobility: Needs Assistance Bed Mobility: Supine to Sit Supine to sit: HOB elevated;Mod assist General bed mobility comments: pt impulsive and requires cues for sequencing and  safety; (A) to bring trunk to sittin gposition at EOB  Transfers Overall transfer level: Needs assistance Equipment used: 2 person hand held assist Transfers: Sit to/from Stand;Stand Pivot Transfers Sit to Stand: +2 physical assistance;Min assist Stand pivot transfers: +2 physical assistance;Min assist General transfer comment: pt required 2 person handheld (A) to maintain balance and (A) to facilitate hip extension and clear bed; cues for upright posture and sequencing; pt with fwd flexed posture throughout transfer; has difficulty taking pivotal steps but is able to do so by WB through UEs     Exercises General Exercises - Lower Extremity Ankle Circles/Pumps: AROM;Both;Strengthening;10 reps;Seated   PT Diagnosis: Difficulty walking;Acute pain  PT Problem List: Decreased strength;Decreased activity tolerance;Decreased balance;Decreased mobility;Decreased knowledge of use of DME;Decreased safety awareness;Decreased knowledge of precautions;Cardiopulmonary status limiting activity;Pain PT Treatment Interventions: DME instruction;Gait training;Stair training;Functional mobility training;Therapeutic activities;Therapeutic exercise;Balance training;Neuromuscular re-education;Patient/family education     PT Goals(Current goals can be found in the care plan section) Acute Rehab PT Goals Patient Stated Goal: to not be out of breath  PT Goal Formulation: With patient Time For Goal Achievement: 04/12/13 Potential to Achieve Goals: Good  Visit Information  Last PT Received On: 03/29/13 Assistance Needed: +2 (for safety and ambulation) History of Present Illness: 73 yo male with hx COPD, CHF, AF on Coumadin presented 3/11 with small SDH after a fall.         Prior Functioning  Home Living Family/patient expects to be discharged to:: Private  residence Living Arrangements: Children Available Help at Discharge: Available 24 hours/day;Family Type of Home: Mobile home Home Access: Stairs to  enter Entrance Stairs-Number of Steps: 4 Entrance Stairs-Rails: Left Home Layout: One level Home Equipment: Emergency planning/management officerhower seat;Walker - 2 wheels Prior Function Level of Independence: Independent with assistive device(s) Comments: pt reports he has been very limited in his mobility secondary to SOB; reports he ambulats to bathroom without any AD and back only; pt reports he is independent with bathing and dressing Communication Communication: HOH Dominant Hand: Right    Cognition  Cognition Arousal/Alertness: Awake/alert Behavior During Therapy: Anxious;Impulsive Overall Cognitive Status: No family/caregiver present to determine baseline cognitive functioning    Extremity/Trunk Assessment Upper Extremity Assessment Upper Extremity Assessment: Defer to OT evaluation Lower Extremity Assessment Lower Extremity Assessment: Generalized weakness Cervical / Trunk Assessment Cervical / Trunk Assessment: Kyphotic   Balance General Comments General comments (skin integrity, edema, etc.): bandage on lt knee and laceration on Lt hand   End of Session PT - End of Session Equipment Utilized During Treatment: Oxygen;Gait belt Activity Tolerance: Patient limited by fatigue;Other (comment) (SOB) Patient left: in chair;with call bell/phone within reach Nurse Communication: Mobility status;Other (comment) (O2)  GP     Shelva MajesticWest, OronoqueBrittany N, South CarolinaPT [phone] 03/29/2013, 3:54 PM

## 2013-03-29 NOTE — Progress Notes (Signed)
PULMONARY / CRITICAL CARE MEDICINE   Name: Billy Casey MRN: [id number] DOB: Apr 03, 1940    ADMISSION DATE:  03/27/2013  REFERRING MD :  Newell CoralNudelman  PRIMARY SERVICE: PCCM  CHIEF COMPLAINT:  SDH  BRIEF PATIENT DESCRIPTION: 73 yo male with hx COPD, CHF, AF on Coumadin presented 3/11 with small SDH after a fall.  Seen by Neurosurgery >>> medical management.  PCCM is asked to admit.  SIGNIFICANT EVENTS / STUDIES:  3/11 CT head >>> 9 mm focal subdural hematoma  LINES / TUBES:  CULTURES:  ANTIBIOTICS:  HISTORY OF PRESENT ILLNESS:  73 yo male with hx COPD, AFib on coumadin, CHF who sustained mechanical fall 3/11, hitting the back of his head.  CT revealed small SDH.  Pt is awake and oriented, c/o headache and back pain.  Denies LOC, blurry vision, lightheadedness, chest pain.  Does c/o SOB above baseline.  Subjective:  C/o back pain Quickly desaturates with any activity  VITAL SIGNS: Temp:  [97.7 F (36.5 C)-99.7 F (37.6 C)] 97.7 F (36.5 C) (03/13 0746) Pulse Rate:  [48-123] 82 (03/13 0800) Resp:  [19-42] 30 (03/13 0800) BP: (110-153)/(58-80) 110/66 mmHg (03/13 0800) SpO2:  [88 %-99 %] 95 % (03/13 0800) FiO2 (%):  [40 %] 40 % (03/13 0700) Weight:  [110.4 kg (243 lb 6.2 oz)] 110.4 kg (243 lb 6.2 oz) (03/13 0500)  HEMODYNAMICS:   VENTILATOR SETTINGS: Vent Mode:  [-]  FiO2 (%):  [40 %] 40 % INTAKE / OUTPUT: Intake/Output     03/12 0701 - 03/13 0700 03/13 0701 - 03/14 0700   I.V. (mL/kg) 420 (3.8) 20 (0.2)   Blood     Total Intake(mL/kg) 420 (3.8) 20 (0.2)   Urine (mL/kg/hr) 1025 (0.4)    Stool 2 (0)    Total Output 1027     Net -607 +20          PHYSICAL EXAMINATION: General:  Chronically ill appearing older male, NAD  Neuro:  Awake, alert, oriented, MAE, appropriate, PERRL HEENT:  Mm moist, no JVD, small laceration to posterior scalp, small amt dried blood Cardiovascular:  s1s2 irreg Lungs:  resps even, mildly labored, NAD, diminished R>L with few scattered  crackles Abdomen:  Soft, +bs Musculoskeletal:  Warm and dry, scant BLE edema   LABS:  CBC  Recent Labs Lab 03/27/13 1028 03/28/13 0202 03/29/13 0235  WBC 11.0* 8.0 8.1  HGB 12.3* 11.1* 10.6*  HCT 37.6* 33.3* 32.7*  PLT 197 144* 131*   Coag's  Recent Labs Lab 03/27/13 1028 03/28/13 0202  INR 1.70* 1.36   BMET  Recent Labs Lab 03/27/13 1028 03/28/13 0202 03/29/13 0235  NA 137 139 137  K 3.6* 3.7 3.4*  CL 94* 97 96  CO2 29 28 28   BUN 15 15 13   CREATININE 0.74 0.79 0.73  GLUCOSE 97 115* 96   Electrolytes  Recent Labs Lab 03/27/13 1028 03/28/13 0202 03/29/13 0235  CALCIUM 9.3 8.6 8.6  MG  --  1.9  --   PHOS  --  3.0  --    Sepsis Markers No results found for this basename: LATICACIDVEN, PROCALCITON, O2SATVEN,  in the last 168 hours ABG No results found for this basename: PHART, PCO2ART, PO2ART,  in the last 168 hours Liver Enzymes No results found for this basename: AST, ALT, ALKPHOS, BILITOT, ALBUMIN,  in the last 168 hours Cardiac Enzymes  Recent Labs Lab 03/27/13 1028 03/28/13 0202  TROPONINI <0.30  --   PROBNP 2511.0* 4057.0*   Glucose  No results found for this basename: GLUCAP,  in the last 168 hours  Imaging Dg Chest 1 View  03/27/2013   CLINICAL DATA Short of breath  EXAM CHEST - 1 VIEW  COMPARISON DG CHEST 2 VIEW dated 03/11/2013; CT CHEST W/CM dated 07/03/2012  FINDINGS There are bilateral patchy interstitial and alveolar airspace opacities more confluent in the right upper lobe. There is no pleural effusion or pneumothorax. Stable cardiomegaly. Single lead cardiac pacer. Unremarkable osseous structures.  IMPRESSION Bilateral patchy interstitial and alveolar airspace opacities more confluent in the right upper lobe. There is likely an element of underlying chronic interstitial lung disease. The appearance is concerning for superimposed mild interstitial edema or atypical infection.  SIGNATURE  Electronically Signed   By: Elige Ko   On:  03/27/2013 12:34   Dg Lumbar Spine Complete  03/27/2013   CLINICAL DATA Fall.  History of L1 fracture  EXAM LUMBAR SPINE - COMPLETE 4+ VIEW  COMPARISON None.  FINDINGS Moderate to severe fracture of L1 which is most likely chronic, specially given the history supporting this.  Mild depression of the superior endplate of L3 which could be due to a recent or chronic fracture.  Disc degeneration and mild spurring L4-5 and L5-S1. No pars defect or mass.  IMPRESSION Moderate to severe compression fracture of L1 which appears chronic  Mild fracture of L3 which could be acute or chronic. MRI would be helpful to evaluate for bone marrow edema and acute fracture.  SIGNATURE  Electronically Signed   By: Marlan Palau M.D.   On: 03/27/2013 14:53   Ct Head Wo Contrast  03/29/2013   CLINICAL DATA:  Fall with head trauma and intracranial hemorrhage.  EXAM: CT HEAD WITHOUT CONTRAST  TECHNIQUE: Contiguous axial images were obtained from the base of the skull through the vertex without intravenous contrast.  COMPARISON:  03/27/2013  FINDINGS: The study suffers from considerable motion degradation. Small focus of subarachnoid hemorrhage at the inferior surface of the falx is no larger, and is probably becoming less distinct. One could question if there is a very minimal (1-2 mm) subdural hematoma along the left posterior parietal convexity. This is difficult to state with certainty given the motion. No large subdural. No additional bleeding is seen. The brain shows mild atrophy and chronic small vessel change as seen previously. Left parietal scalp hematoma appear similar. No skull fracture. No fluid in the sinuses. No hydrocephalus.  IMPRESSION: Significant motion degradation.  Small amount of subarachnoid blood just inferior to the central falx is becoming less distinct. No increasing subarachnoid blood.  Left parietal scalp hematoma.  Question very thin (1-2 mm) subdural hematoma along the left parietal convexity. Difficult  to state with certainty because of motion.   Electronically Signed   By: Paulina Fusi M.D.   On: 03/29/2013 07:48   Ct Head Wo Contrast  03/27/2013   CLINICAL DATA Patient fell walking to the car and can't remember the fall, no loss of consciousness reported by a witness, dizziness  EXAM CT HEAD WITHOUT CONTRAST  TECHNIQUE Contiguous axial images were obtained from the base of the skull through the vertex without intravenous contrast.  COMPARISON None.  FINDINGS No skull fracture. There is a moderate scalp hematoma posteriorly over the left parieto-occipital region. There is mild to moderate diffuse atrophy and mild low attenuation in the deep white matter. No evidence of vascular territory infarct. There is a 9 mm oval focus of hyperattenuation in the falx centrally above the level of  the ventricles consistent with a tiny subdural hematoma. There is no other evidence of extra-axial fluid and there is no parenchymal hematoma identified. There is no hydrocephalus.  IMPRESSION 9 mm focal subdural hematoma. Critical Value/emergent results were called by telephone at the time of interpretation on 03/27/2013 at 2:33 PM to Dr. Blake Divine , who verbally acknowledged these results.  SIGNATURE  Electronically Signed   By: Esperanza Heir M.D.   On: 03/27/2013 14:33   Dg Chest Port 1 View  03/28/2013   CLINICAL DATA:  Acute shortness of breath.  EXAM: PORTABLE CHEST - 1 VIEW  COMPARISON:  03/27/2013  FINDINGS: Irregular interstitial and patchy airspace opacities are distributed in a heterogeneous distribution in the lungs, more prominent in the right mid lung and in the left mid and lower lung. Allowing for differences in technique and patient positioning, findings are stable from the previous day's study.  No convincing pleural effusion.  No pneumothorax.  Cardiac silhouette is mildly enlarged no mediastinal or hilar masses. Single lead pacemaker is stable in well positioned.  IMPRESSION: 1. No significant change from  the previous day's study. 2. Bilateral arm opacities described above are mostly due to chronic interstitial disease based on more remote exams. Superimposed infectious or inflammatory infiltrate, however, should be considered likely in the proper clinical setting. No convincing edema.   Electronically Signed   By: Amie Portland M.D.   On: 03/28/2013 19:12   ASSESSMENT / PLAN:  PULMONARY A: COPD (Dr. Sherene Sires), no evidence of exacerbation but he does apparently have severe disease + hypoxemia Acute Resp failure  > due to the above + atx P:   Goal SpO2>92 Supplemental oxygen PRN > likely needs to wear 24x7 DuoNebs Cough suppression 3/12 Mobilize  CARDIOVASCULAR A: AF (Dr. Allyson Sabal) Chronic diastolic CHF, no evidence of exacerbation HTN but transiently hypotensive upon initial presentation P:  Goal BP < 140/80 Toprol 25 ( half preadmission dose ) Hold Hydralazine  RENAL A: Hypokalemia - mild P:   Trend BMP Hold Bumex  GASTROINTESTINAL A: GERD  P:   Start diet 3/12 Continue home Nexium   HEMATOLOGIC A: Coumadin coagulopathy Mild anemia VTE Px P:  Hold Coumadin Trend CBC / INR Given Vit K / FFP  SCDs  INFECTIOUS A: No active issues  P:   No intervention required  ENDOCRINE A: No active issues  P:   No intervention required  NEUROLOGIC A: SAH/SDH, small > almost fully resolved by CT scan on 3/13 Acute encephalopathy Back pain P:   Cleared for d/c by NSGY; no surgery or f/u needed PO pain control   I have personally obtained history, examined patient, evaluated and interpreted laboratory and imaging results, reviewed medical records, formulated assessment / plan and placed orders.  Feel that he can move to SDU bed. He is cleared neurologically, but remains SOB with any activity. He will likely ned a few more days to stabilize his respiratory status.   Levy Pupa, MD, PhD 03/29/2013, 11:38 AM [facility] [phone] or if no answer  [phone]

## 2013-03-30 LAB — BASIC METABOLIC PANEL
BUN: 11 mg/dL (ref 6–23)
CHLORIDE: 94 meq/L — AB (ref 96–112)
CO2: 26 meq/L (ref 19–32)
Calcium: 8.9 mg/dL (ref 8.4–10.5)
Creatinine, Ser: 0.61 mg/dL (ref 0.50–1.35)
GFR calc Af Amer: >90 mL/min (ref >90)
GFR calc non Af Amer: >90 mL/min (ref >90)
Glucose, Bld: 124 mg/dL — ABNORMAL HIGH (ref 70–99)
POTASSIUM: 4.1 meq/L (ref 3.7–5.3)
SODIUM: 132 meq/L — AB (ref 137–147)

## 2013-03-30 MED ORDER — GUAIFENESIN ER 600 MG PO TB12
1200.0000 mg | ORAL_TABLET | Freq: Two times a day (BID) | ORAL | Status: DC
  Administered 2013-03-30 – 2013-04-01 (×5): 1200 mg via ORAL
  Filled 2013-03-30 (×7): qty 2

## 2013-03-30 MED ORDER — FUROSEMIDE 10 MG/ML IJ SOLN
40.0000 mg | Freq: Two times a day (BID) | INTRAMUSCULAR | Status: AC
  Administered 2013-03-30 – 2013-04-01 (×5): 40 mg via INTRAVENOUS
  Filled 2013-03-30 (×7): qty 4

## 2013-03-30 NOTE — Progress Notes (Signed)
Physical Therapy Treatment Patient Details Name: Billy Casey D Bree MRN: [id number] DOB: 12/14/1940 Today's Date: 03/30/2013 Time: [id number] PT Time Calculation (min): 12 min  PT Assessment / Plan / Recommendation  History of Present Illness     PT Comments   Increased gait ability today.  Mobility continues to be limited by desaturation/SOB.  Follow Up Recommendations  Home health PT;Supervision/Assistance - 24 hour     Does the patient have the potential to tolerate intense rehabilitation     Barriers to Discharge        Equipment Recommendations  None recommended by PT    Recommendations for Other Services OT consult  Frequency Min 4X/week   Progress towards PT Goals Progress towards PT goals: Progressing toward goals  Plan Current plan remains appropriate    Precautions / Restrictions Precautions Precautions: Fall   Pertinent Vitals/Pain     Mobility  Bed Mobility Supine to sit: Mod assist Transfers Sit to Stand: Min assist;+2 safety/equipment Stand pivot transfers: Min assist;+2 safety/equipment General transfer comment: verbal cues for hand placement Ambulation/Gait Ambulation/Gait assistance: Min assist Ambulation Distance (Feet): 8 Feet Assistive device: Rolling walker (2 wheeled) Gait Pattern/deviations: Step-through pattern;Decreased stride length Gait velocity interpretation: Below normal speed for age/gender    Exercises     PT Diagnosis:    PT Problem List:   PT Treatment Interventions:     PT Goals (current goals can now be found in the care plan section)    Visit Information  Last PT Received On: 03/30/13 Assistance Needed: +2    Subjective Data      Cognition  Cognition Arousal/Alertness: Awake/alert Behavior During Therapy: Agitated Overall Cognitive Status: Within Functional Limits for tasks assessed    Balance     End of Session PT - End of Session Equipment Utilized During Treatment: Gait belt;Oxygen Activity Tolerance:  Patient limited by fatigue (O2 sat at 81 after ambulation) Patient left: in chair;with call bell/phone within reach Nurse Communication: Mobility status   GP     Ilda FoilGarrow, Ronia Hazelett Rene 03/30/2013, 4:29 PM  Aida RaiderWendy Doug Bucklin, PT  Office # [phone] Pager [phone]

## 2013-03-30 NOTE — Progress Notes (Signed)
PULMONARY / CRITICAL CARE MEDICINE   Name: Billy Casey MRN: [id number] DOB: [birth year]    ADMISSION DATE:  03/27/2013  REFERRING MD :  Newell CoralNudelman  PRIMARY SERVICE: PCCM  CHIEF COMPLAINT:  SDH  BRIEF PATIENT DESCRIPTION: 73 yo male with hx COPD, CHF, AF on Coumadin presented 3/11 with small SDH after a fall.  Seen by Neurosurgery >>> medical management.  PCCM is asked to admit.  SIGNIFICANT EVENTS / STUDIES:  3/11 CT head >>> 9 mm focal subdural hematoma  LINES / TUBES:  CULTURES:  ANTIBIOTICS:  HISTORY OF PRESENT ILLNESS:  73 yo male with hx COPD, AFib on coumadin, CHF who sustained mechanical fall 3/11, hitting the back of his head.  CT revealed small SDH.  Pt is awake and oriented, c/o headache and back pain.  Denies LOC, blurry vision, lightheadedness, chest pain.  Does c/o SOB above baseline.  Subjective:  C/o back pain Quickly desaturates with any activity  VITAL SIGNS: Temp:  [97.8 F (36.6 C)-99.1 F (37.3 C)] 97.8 F (36.6 C) (03/14 0336) Pulse Rate:  [50-88] 77 (03/14 0700) Resp:  [18-31] 29 (03/14 0700) BP: (99-147)/(49-116) 138/73 mmHg (03/14 0700) SpO2:  [86 %-100 %] 95 % (03/14 0815) FiO2 (%):  [31 %-45 %] 31 % (03/14 0815) Weight:  [113.6 kg (250 lb 7.1 oz)] 113.6 kg (250 lb 7.1 oz) (03/14 0422)  HEMODYNAMICS:   VENTILATOR SETTINGS: Vent Mode:  [-]  FiO2 (%):  [31 %-45 %] 31 % INTAKE / OUTPUT: Intake/Output     03/13 0701 - 03/14 0700 03/14 0701 - 03/15 0700   P.O. 300    I.V. (mL/kg) 480 (4.2)    Total Intake(mL/kg) 780 (6.9)    Urine (mL/kg/hr) 990 (0.4)    Stool     Total Output 990     Net -210            PHYSICAL EXAMINATION: General:  Chronically ill appearing older male, NAD , co back pain Neuro:  Awake, alert, oriented, MAE, appropriate, PERRL HEENT:  Mm moist, no JVD, small laceration to posterior scalp,  Cardiovascular:  s1s2 irreg Lungs:  resps even, mildly labored, NAD, diminished R>L with few scattered crackles Abdomen:   Soft, +bs Musculoskeletal:  Warm and dry, scant BLE edema   LABS:  PULMONARY No results found for this basename: PHART, PCO2, PCO2ART, PO2, PO2ART, HCO3, TCO2, O2SAT,  in the last 168 hours  CBC  Recent Labs Lab 03/27/13 1028 03/28/13 0202 03/29/13 0235  HGB 12.3* 11.1* 10.6*  HCT 37.6* 33.3* 32.7*  WBC 11.0* 8.0 8.1  PLT 197 144* 131*    COAGULATION  Recent Labs Lab 03/27/13 1028 03/28/13 0202  INR 1.70* 1.36    CARDIAC   Recent Labs Lab 03/27/13 1028  TROPONINI <0.30    Recent Labs Lab 03/27/13 1028 03/28/13 0202  PROBNP 2511.0* 4057.0*     CHEMISTRY  Recent Labs Lab 03/27/13 1028 03/28/13 0202 03/29/13 0235 03/30/13 1055  NA 137 139 137 132*  K 3.6* 3.7 3.4* 4.1  CL 94* 97 96 94*  CO2 29 28 28 26   GLUCOSE 97 115* 96 124*  BUN 15 15 13 11   CREATININE 0.74 0.79 0.73 0.61  CALCIUM 9.3 8.6 8.6 8.9  MG  --  1.9  --   --   PHOS  --  3.0  --   --    Estimated Creatinine Clearance: 115.1 ml/min (by C-G formula based on Cr of 0.61).   LIVER  Recent Labs  Lab 03/27/13 1028 03/28/13 0202  INR 1.70* 1.36     INFECTIOUS No results found for this basename: LATICACIDVEN, PROCALCITON,  in the last 168 hours   ENDOCRINE CBG (last 3)  No results found for this basename: GLUCAP,  in the last 72 hours       IMAGING x48h  Ct Head Wo Contrast  03/29/2013   CLINICAL DATA:  Fall with head trauma and intracranial hemorrhage.  EXAM: CT HEAD WITHOUT CONTRAST  TECHNIQUE: Contiguous axial images were obtained from the base of the skull through the vertex without intravenous contrast.  COMPARISON:  03/27/2013  FINDINGS: The study suffers from considerable motion degradation. Small focus of subarachnoid hemorrhage at the inferior surface of the falx is no larger, and is probably becoming less distinct. One could question if there is a very minimal (1-2 mm) subdural hematoma along the left posterior parietal convexity. This is difficult to state with  certainty given the motion. No large subdural. No additional bleeding is seen. The brain shows mild atrophy and chronic small vessel change as seen previously. Left parietal scalp hematoma appear similar. No skull fracture. No fluid in the sinuses. No hydrocephalus.  IMPRESSION: Significant motion degradation.  Small amount of subarachnoid blood just inferior to the central falx is becoming less distinct. No increasing subarachnoid blood.  Left parietal scalp hematoma.  Question very thin (1-2 mm) subdural hematoma along the left parietal convexity. Difficult to state with certainty because of motion.   Electronically Signed   By: Paulina Fusi M.D.   On: 03/29/2013 07:48   Dg Chest Port 1 View  03/28/2013   CLINICAL DATA:  Acute shortness of breath.  EXAM: PORTABLE CHEST - 1 VIEW  COMPARISON:  03/27/2013  FINDINGS: Irregular interstitial and patchy airspace opacities are distributed in a heterogeneous distribution in the lungs, more prominent in the right mid lung and in the left mid and lower lung. Allowing for differences in technique and patient positioning, findings are stable from the previous day's study.  No convincing pleural effusion.  No pneumothorax.  Cardiac silhouette is mildly enlarged no mediastinal or hilar masses. Single lead pacemaker is stable in well positioned.  IMPRESSION: 1. No significant change from the previous day's study. 2. Bilateral arm opacities described above are mostly due to chronic interstitial disease based on more remote exams. Superimposed infectious or inflammatory infiltrate, however, should be considered likely in the proper clinical setting. No convincing edema.   Electronically Signed   By: Amie Portland M.D.   On: 03/28/2013 19:12      ASSESSMENT / PLAN:  PULMONARY A: COPD (Dr. Sherene Sires), no evidence of exacerbation but he does apparently have severe disease + hypoxemia Acute Resp failure  > due to the above + atx, suspect diastolic CHF as well P:   Goal  SpO2>92  likely needs to wear 24x7 for now DuoNebs Cough suppression 3/12 Mobilize  CARDIOVASCULAR A: AF (Dr. Allyson Sabal) Chronic diastolic CHF, no evidence of exacerbation HTN but transiently hypotensive upon initial presentation   - BNP high and hypoxemic P:  STart diuresis  Goal BP < 140/80 Toprol 25 ( half preadmission dose ) Hold Hydralazine  RENAL A: Hypokalemia - mild P:   Trend BMP Hold Bumex  GASTROINTESTINAL A: GERD  P:   Start diet 3/12 Continue home Nexium   HEMATOLOGIC A: Coumadin coagulopathy Mild anemia VTE Px P:  Hold Coumadin Trend CBC / INR Given Vit K / FFP  SCDs  INFECTIOUS A: No active issues  P:   No intervention required  ENDOCRINE A: No active issues  P:   No intervention required  NEUROLOGIC A: SAH/SDH, small > almost fully resolved by CT scan on 3/13 Acute encephalopathy Back pain P:   Cleared for d/c by NSGY; no surgery or f/u needed PO pain control  Brett Canales Minor ACNP [facility] PCCM Pager [phone] till 3 pm If no answer page [phone] 03/30/2013, 9:15 AM  STAFF MD note Feel that he can move to SDU bed. He is cleared neurologically, but remains SOB with any activity. He will likely ned a few more days to stabilize his respiratory status.  Will start  Lasix. Get echo to assess CHF again  Dr. Kalman Shan, M.D., [facility].C.P Pulmonary and Critical Care Medicine Staff Physician [facility] Pager: [phone], If no answer or between  15:00h - 7:00h: call [phone]  03/30/2013 5:44 PM

## 2013-03-31 ENCOUNTER — Inpatient Hospital Stay (HOSPITAL_COMMUNITY): Payer: Medicare Other

## 2013-03-31 DIAGNOSIS — J8410 Pulmonary fibrosis, unspecified: Secondary | ICD-10-CM

## 2013-03-31 LAB — CBC WITH DIFFERENTIAL/PLATELET
BASOS ABS: 0 10*3/uL (ref 0.0–0.1)
Basophils Relative: 1 % (ref 0–1)
Eosinophils Absolute: 0.2 10*3/uL (ref 0.0–0.7)
Eosinophils Relative: 2 % (ref 0–5)
HCT: 29.1 % — ABNORMAL LOW (ref 39.0–52.0)
Hemoglobin: 9.6 g/dL — ABNORMAL LOW (ref 13.0–17.0)
Lymphocytes Relative: 12 % (ref 12–46)
Lymphs Abs: 1 10*3/uL (ref 0.7–4.0)
MCH: 32.3 pg (ref 26.0–34.0)
MCHC: 33 g/dL (ref 30.0–36.0)
MCV: 98 fL (ref 78.0–100.0)
Monocytes Absolute: 0.8 10*3/uL (ref 0.1–1.0)
Monocytes Relative: 9 % (ref 3–12)
NEUTROS ABS: 6.5 10*3/uL (ref 1.7–7.7)
Neutrophils Relative %: 76 % (ref 43–77)
Platelets: 123 10*3/uL — ABNORMAL LOW (ref 150–400)
RBC: 2.97 MIL/uL — ABNORMAL LOW (ref 4.22–5.81)
RDW: 16 % — AB (ref 11.5–15.5)
WBC: 8.5 10*3/uL (ref 4.0–10.5)

## 2013-03-31 LAB — BASIC METABOLIC PANEL
BUN: 12 mg/dL (ref 6–23)
CHLORIDE: 92 meq/L — AB (ref 96–112)
CO2: 28 mEq/L (ref 19–32)
CREATININE: 0.63 mg/dL (ref 0.50–1.35)
Calcium: 8.6 mg/dL (ref 8.4–10.5)
GFR calc non Af Amer: >90 mL/min (ref >90)
Glucose, Bld: 114 mg/dL — ABNORMAL HIGH (ref 70–99)
Potassium: 3.2 mEq/L — ABNORMAL LOW (ref 3.7–5.3)
Sodium: 132 mEq/L — ABNORMAL LOW (ref 137–147)

## 2013-03-31 LAB — MAGNESIUM: MAGNESIUM: 1.8 mg/dL (ref 1.5–2.5)

## 2013-03-31 LAB — PHOSPHORUS: PHOSPHORUS: 2.5 mg/dL (ref 2.3–4.6)

## 2013-03-31 LAB — PRO B NATRIURETIC PEPTIDE
PRO B NATRI PEPTIDE: 3318 pg/mL — AB (ref 0–125)
PRO B NATRI PEPTIDE: 3242 pg/mL — AB (ref 0–125)

## 2013-03-31 MED ORDER — MAGNESIUM SULFATE 40 MG/ML IJ SOLN
2.0000 g | Freq: Once | INTRAMUSCULAR | Status: AC
  Administered 2013-03-31: 2 g via INTRAVENOUS
  Filled 2013-03-31: qty 50

## 2013-03-31 MED ORDER — POTASSIUM CHLORIDE CRYS ER 20 MEQ PO TBCR
30.0000 meq | EXTENDED_RELEASE_TABLET | ORAL | Status: AC
  Administered 2013-03-31 (×2): 30 meq via ORAL
  Filled 2013-03-31 (×4): qty 1

## 2013-03-31 NOTE — Progress Notes (Signed)
[facility]AC[facility] ONLY  The patient does apply for the [facility] Adult ICU Electrolyte Replacment Protocol based on the criteria listed below:   1. Is GFR >/= 40 ml/min? yes  Patient's GFR today is >90 2. Is urine output >/= 0.5 ml/kg/hr for the last 6 hours? yes Patient's UOP is 0.7 ml/kg/hr 3. Is BUN < 60 mg/dL? yes  Patient's BUN today is 12 4. Abnormal electrolyte(s): K3.2 5. Ordered repletion with: 60meq 6. If a panic level lab has been reported, has the CCM MD in charge been notified? yes.   Physician:  Holland CommonsE Deterding, MD  [place] NakayamaChisholm, Genae Strine William 03/31/2013 5:55 AM

## 2013-03-31 NOTE — Progress Notes (Signed)
PULMONARY / CRITICAL CARE MEDICINE   Name: Billy Casey MRN: [id number] DOB: Apr 22, 1940    ADMISSION DATE:  03/27/2013  REFERRING MD :  Newell Coral  PRIMARY SERVICE: PCCM  CHIEF COMPLAINT:  SDH  BRIEF PATIENT DESCRIPTION: 73 yo male with hx COPD, CHF, AF on Coumadin presented 3/11 with small SDH after a fall.  Seen by Neurosurgery >>> medical management.  PCCM is asked to admit.  SIGNIFICANT EVENTS / STUDIES:  3/11 CT head >>> 9 mm focal subdural hematoma  LINES / TUBES:  CULTURES:  ANTIBIOTICS:  HISTORY OF PRESENT ILLNESS:  73 yo male with hx COPD, AFib on coumadin, CHF who sustained mechanical fall 3/11, hitting the back of his head.  CT revealed small SDH.  Pt is awake and oriented, c/o headache and back pain.  Denies LOC, blurry vision, lightheadedness, chest pain.  Does c/o SOB above baseline.  Subjective:  Breathing better, sats higher  VITAL SIGNS: Temp:  [97.8 F (36.6 C)-98.3 F (36.8 C)] 98.2 F (36.8 C) (03/15 0400) Pulse Rate:  [59-79] 59 (03/15 0400) Resp:  [23-27] 27 (03/15 0400) BP: (108-136)/(63-93) 129/63 mmHg (03/15 0400) SpO2:  [90 %-97 %] 92 % (03/15 0400) FiO2 (%):  [31 %] 31 % (03/15 0108) Weight:  [113.1 kg (249 lb 5.4 oz)] 113.1 kg (249 lb 5.4 oz) (03/15 0400)  HEMODYNAMICS:   VENTILATOR SETTINGS: Vent Mode:  [-]  FiO2 (%):  [31 %] 31 % INTAKE / OUTPUT: Intake/Output     03/14 0701 - 03/15 0700 03/15 0701 - 03/16 0700   P.O. 920    I.V. (mL/kg) 460 (4.1)    Total Intake(mL/kg) 1380 (12.2)    Urine (mL/kg/hr) 2775 (1)    Total Output 2775     Net -1395            PHYSICAL EXAMINATION: General:  Chronically ill appearing older male, NAD @rest  Neuro:  Awake, alert, oriented, MAE, appropriate, PERRL HEENT:  Mm moist, no JVD, small laceration to posterior scalp,  Cardiovascular:  s1s2 irreg Lungs:  resps even, mildly labored, NAD, diminished in bases Abdomen:  Soft, +bs Musculoskeletal:  Warm and dry, scant BLE edema    LABS:  PULMONARY No results found for this basename: PHART, PCO2, PCO2ART, PO2, PO2ART, HCO3, TCO2, O2SAT,  in the last 168 hours  CBC  Recent Labs Lab 03/28/13 0202 03/29/13 0235 03/31/13 0238  HGB 11.1* 10.6* 9.6*  HCT 33.3* 32.7* 29.1*  WBC 8.0 8.1 8.5  PLT 144* 131* 123*    COAGULATION  Recent Labs Lab 03/27/13 1028 03/28/13 0202  INR 1.70* 1.36    CARDIAC    Recent Labs Lab 03/27/13 1028  TROPONINI <0.30    Recent Labs Lab 03/27/13 1028 03/28/13 0202 03/31/13 0238  PROBNP 2511.0* 4057.0* 3242.0*     CHEMISTRY  Recent Labs Lab 03/27/13 1028 03/28/13 0202 03/29/13 0235 03/30/13 1055 03/31/13 0238  NA 137 139 137 132* 132*  K 3.6* 3.7 3.4* 4.1 3.2*  CL 94* 97 96 94* 92*  CO2 29 28 28 26 28   GLUCOSE 97 115* 96 124* 114*  BUN 15 15 13 11 12   CREATININE 0.74 0.79 0.73 0.61 0.63  CALCIUM 9.3 8.6 8.6 8.9 8.6  MG  --  1.9  --   --  1.8  PHOS  --  3.0  --   --  2.5   Estimated Creatinine Clearance: 114.9 ml/min (by C-G formula based on Cr of 0.63).   LIVER  Recent Labs Lab  03/27/13 1028 03/28/13 0202  INR 1.70* 1.36     INFECTIOUS No results found for this basename: LATICACIDVEN, PROCALCITON,  in the last 168 hours   ENDOCRINE CBG (last 3)  No results found for this basename: GLUCAP,  in the last 72 hours     Intake/Output Summary (Last 24 hours) at 03/31/13 0808 Last data filed at 03/31/13 0600  Gross per 24 hour  Intake   1240 ml  Output   2775 ml  Net  -1535 ml     IMAGING x48h  No results found.    ASSESSMENT / PLAN:  PULMONARY A: COPD  And ILD NOS (Dr. Sherene SiresWert), no evidence of exacerbation but he does apparently have severe disease + hypoxemia Acute Resp failure  > due to the above + atx, suspect diastolic CHF as well  - Diuresis 3/14 ; some better 03/31/13 with sats and on 4L 03/31/13 but not feeling better. Hypoxemia likely copd, ild and diast chf  P:   Goal SpO2>92 DuoNebs Cough suppression  3/12 Mobilize  CARDIOVASCULAR A: AF (Dr. Allyson SabalBerry) Chronic diastolic CHF, no evidence of exacerbation HTN but transiently hypotensive upon initial presentation   - BNP high and hypoxemic P:  Continue  Diuresis since 3/14 Goal BP < 140/80 Toprol 25 ( half preadmission dose ) Hold Hydralazine Check ECHO (last 2014 June was ok)  RENAL A: Hypokalemia - mild P:   Replete as needed Trend BMP Hold Bumex  GASTROINTESTINAL A: GERD  P:   Start diet 3/12 Continue home Nexium   HEMATOLOGIC A: Coumadin coagulopathy Mild anemia VTE Px P:  Hold Coumadin Trend CBC / INR Given Vit K / FFP  SCDs  INFECTIOUS A: No active issues  P:   No intervention required  ENDOCRINE A: No active issues  P:   No intervention required  NEUROLOGIC A: SAH/SDH, small > almost fully resolved by CT scan on 3/13 Acute encephalopathy Back pain P:   Cleared for d/c by NSGY; no surgery or f/u needed PO pain control  GLOBAL 3/15: very deconditioned. Needs PT  Brett CanalesSteve Minor ACNP [facility] Pager [phone] till 3 pm If no answer page [phone] 03/31/2013, 8:01 AM     STAFF NOTE 3/15: very deconditioned but also hypxoemic due to ILD, COPD and acute on chronic diastolic chf. Slow improvement so far. Will continue lasix. Check echo. GET CXR   Dr. Kalman ShanMurali Bertrum Helmstetter, M.D., [facility] Pulmonary and Critical Care Medicine Staff Physician [facility]monary and Critical Care Pager: [phone], If no answer or between  15:00h - 7:00h: call [phone]  03/31/2013 5:09 PM

## 2013-04-01 ENCOUNTER — Inpatient Hospital Stay (HOSPITAL_COMMUNITY): Payer: Medicare Other

## 2013-04-01 DIAGNOSIS — I369 Nonrheumatic tricuspid valve disorder, unspecified: Secondary | ICD-10-CM

## 2013-04-01 LAB — CBC WITH DIFFERENTIAL/PLATELET
Basophils Absolute: 0 10*3/uL (ref 0.0–0.1)
Basophils Relative: 0 % (ref 0–1)
EOS PCT: 3 % (ref 0–5)
Eosinophils Absolute: 0.3 10*3/uL (ref 0.0–0.7)
HCT: 30.7 % — ABNORMAL LOW (ref 39.0–52.0)
Hemoglobin: 10.2 g/dL — ABNORMAL LOW (ref 13.0–17.0)
LYMPHS ABS: 1.2 10*3/uL (ref 0.7–4.0)
Lymphocytes Relative: 13 % (ref 12–46)
MCH: 32.6 pg (ref 26.0–34.0)
MCHC: 33.2 g/dL (ref 30.0–36.0)
MCV: 98.1 fL (ref 78.0–100.0)
MONOS PCT: 7 % (ref 3–12)
Monocytes Absolute: 0.7 10*3/uL (ref 0.1–1.0)
Neutro Abs: 7 10*3/uL (ref 1.7–7.7)
Neutrophils Relative %: 76 % (ref 43–77)
PLATELETS: 138 10*3/uL — AB (ref 150–400)
RBC: 3.13 MIL/uL — AB (ref 4.22–5.81)
RDW: 16.1 % — ABNORMAL HIGH (ref 11.5–15.5)
WBC: 9.2 10*3/uL (ref 4.0–10.5)

## 2013-04-01 LAB — BASIC METABOLIC PANEL
BUN: 11 mg/dL (ref 6–23)
CALCIUM: 8.6 mg/dL (ref 8.4–10.5)
CO2: 27 mEq/L (ref 19–32)
Chloride: 94 mEq/L — ABNORMAL LOW (ref 96–112)
Creatinine, Ser: 0.63 mg/dL (ref 0.50–1.35)
GFR calc Af Amer: >90 mL/min (ref >90)
GLUCOSE: 101 mg/dL — AB (ref 70–99)
Potassium: 3.6 mEq/L — ABNORMAL LOW (ref 3.7–5.3)
Sodium: 134 mEq/L — ABNORMAL LOW (ref 137–147)

## 2013-04-01 LAB — MAGNESIUM: Magnesium: 1.9 mg/dL (ref 1.5–2.5)

## 2013-04-01 LAB — PHOSPHORUS: Phosphorus: 2.6 mg/dL (ref 2.3–4.6)

## 2013-04-01 LAB — PROCALCITONIN: PROCALCITONIN: 0.12 ng/mL

## 2013-04-01 MED ORDER — POTASSIUM CHLORIDE CRYS ER 20 MEQ PO TBCR
40.0000 meq | EXTENDED_RELEASE_TABLET | Freq: Once | ORAL | Status: AC
  Administered 2013-04-01: 40 meq via ORAL
  Filled 2013-04-01: qty 2

## 2013-04-01 MED ORDER — FENTANYL CITRATE 0.05 MG/ML IJ SOLN
50.0000 ug | Freq: Once | INTRAMUSCULAR | Status: AC
  Administered 2013-04-01: 50 ug via INTRAVENOUS
  Filled 2013-04-01: qty 2

## 2013-04-01 MED ORDER — POTASSIUM CHLORIDE CRYS ER 20 MEQ PO TBCR
20.0000 meq | EXTENDED_RELEASE_TABLET | Freq: Once | ORAL | Status: AC
  Administered 2013-04-01: 20 meq via ORAL

## 2013-04-01 MED ORDER — OXYCODONE HCL 5 MG PO TABS
10.0000 mg | ORAL_TABLET | ORAL | Status: DC | PRN
  Administered 2013-04-01 – 2013-04-05 (×12): 10 mg via ORAL
  Filled 2013-04-01 (×12): qty 2

## 2013-04-01 NOTE — Progress Notes (Signed)
PULMONARY / CRITICAL CARE MEDICINE   Name: Billy Casey D Grange MRN: [id number] DOB: 06/29/1940    ADMISSION DATE:  03/27/2013  REFERRING MD :  Newell CoralNudelman  PRIMARY SERVICE: PCCM  CHIEF COMPLAINT:  SDH  BRIEF PATIENT DESCRIPTION: 73 yo male with hx COPD, CHF, AF on Coumadin presented 3/11 with small SDH after a fall.  Seen by Neurosurgery > medical management. O2 at 2L/min at home. PCCM is asked to admit.  SIGNIFICANT EVENTS / STUDIES:  3/11 CT head >>> 9 mm focal subdural hematoma 3/13 CT head >>> Small amount of SAH, size stable. ? 1-2 mm L parietal SDH 3/16 TTE >>>  LINES / TUBES: PIV   CULTURES: 3/11 MRSA by PCR > POS  ANTIBIOTICS:  HISTORY OF PRESENT ILLNESS:  73 yo male with hx COPD, AFib on coumadin, CHF who sustained mechanical fall 3/11, hitting the back of his head.  CT revealed small SDH.  Pt is awake and oriented, c/o headache and back pain.  Denies LOC, blurry vision, lightheadedness, chest pain.  Does c/o SOB above baseline.  INTERVAL HISTORY: States that he feels worse today. O2 sats high 80's on 4L/min.  VITAL SIGNS: Temp:  [97.8 F (36.6 C)-98.5 F (36.9 C)] 98.3 F (36.8 C) (03/16 0808) Pulse Rate:  [71-83] 83 (03/16 0808) Resp:  [21-29] 22 (03/16 0808) BP: (120-145)/(60-96) 123/71 mmHg (03/16 0808) SpO2:  [91 %-100 %] 94 % (03/16 0900) Weight:  [108.5 kg (239 lb 3.2 oz)] 108.5 kg (239 lb 3.2 oz) (03/16 0300)  HEMODYNAMICS:   VENTILATOR SETTINGS:   INTAKE / OUTPUT: Intake/Output     03/15 0701 - 03/16 0700 03/16 0701 - 03/17 0700   P.O. 960    I.V. (mL/kg) 470 (4.3) 40 (0.4)   IV Piggyback 50    Total Intake(mL/kg) 1480 (13.6) 40 (0.4)   Urine (mL/kg/hr) 2700 (1) 1500 (3.7)   Total Output 2700 1500   Net -1220 -1460          PHYSICAL EXAMINATION: General:  Chronically ill appearing older male, NAD @rest  Neuro:  Awake, alert, oriented HEENT:  Mm moist, no JVD, small laceration to posterior scalp,  Cardiovascular:  s1s2 irreg Lungs:  resps  even, mildly labored, NAD, diminished in bases Abdomen:  Soft, +bs Musculoskeletal:  Warm and dry, scant BLE edema   LABS:  PULMONARY No results found for this basename: PHART, PCO2, PCO2ART, PO2, PO2ART, HCO3, TCO2, O2SAT,  in the last 168 hours  CBC  Recent Labs Lab 03/29/13 0235 03/31/13 0238 04/01/13 0245  HGB 10.6* 9.6* 10.2*  HCT 32.7* 29.1* 30.7*  WBC 8.1 8.5 9.2  PLT 131* 123* 138*    COAGULATION  Recent Labs Lab 03/27/13 1028 03/28/13 0202  INR 1.70* 1.36    CARDIAC    Recent Labs Lab 03/27/13 1028  TROPONINI <0.30    Recent Labs Lab 03/27/13 1028 03/28/13 0202 03/31/13 0238  PROBNP 2511.0* 4057.0* 3242.0*  3318.0*     CHEMISTRY  Recent Labs Lab 03/28/13 0202 03/29/13 0235 03/30/13 1055 03/31/13 0238 04/01/13 0245  NA 139 137 132* 132* 134*  K 3.7 3.4* 4.1 3.2* 3.6*  CL 97 96 94* 92* 94*  CO2 28 28 26 28 27   GLUCOSE 115* 96 124* 114* 101*  BUN 15 13 11 12 11   CREATININE 0.79 0.73 0.61 0.63 0.63  CALCIUM 8.6 8.6 8.9 8.6 8.6  MG 1.9  --   --  1.8 1.9  PHOS 3.0  --   --  2.5 2.6  Estimated Creatinine Clearance: 112.7 ml/min (by C-G formula based on Cr of 0.63).   LIVER  Recent Labs Lab 03/27/13 1028 03/28/13 0202  INR 1.70* 1.36     INFECTIOUS No results found for this basename: LATICACIDVEN, PROCALCITON,  in the last 168 hours   ENDOCRINE CBG (last 3)  No results found for this basename: GLUCAP,  in the last 72 hours     Intake/Output Summary (Last 24 hours) at 04/01/13 1046 Last data filed at 04/01/13 0900  Gross per 24 hour  Intake   1340 ml  Output   3500 ml  Net  -2160 ml     IMAGING x48h  Dg Chest [facility] 1 View  04/01/2013   CLINICAL DATA:  Respiratory failure.  EXAM: PORTABLE CHEST - 1 VIEW  COMPARISON:  03/28/2013 and 07/03/2012  FINDINGS: Stable appearance of the left cardiac single lead pacemaker. There are diffuse interstitial lung densities throughout both lungs. There is mild sparing of the  interstitial disease in the left lung apex region. Heart size remains enlarged. The parenchymal lung disease has not significantly changed since the prior examination. Negative for a pneumothorax.  IMPRESSION: Diffuse parenchymal lung densities suggest chronic interstitial lung changes. It is difficult to exclude acute on chronic disease. There has been minimal change from the previous examination.   Electronically Signed   By: Richarda Overlie M.D.   On: 04/01/2013 07:41     CXR: 3/16 Chronic lung changes throughout. Left opacities appear worsened today.   ASSESSMENT / PLAN:  PULMONARY A: COPD  And ILD NOS (Dr. Sherene Sires), no evidence of exacerbation but he does apparently have severe disease + hypoxemia Acute Resp failure  > due to the above + atx, suspect diastolic CHF as well  P:   Goal SpO2>92 DuoNebs If no improvement with diuresis may need to explore possible PE DVT.  Cough suppression 3/12 Mobilize  CARDIOVASCULAR A: AF (Dr. Allyson Sabal) Acute on chronic diastolic CHF elevated BUN, SOB, TTE pending HTN but transiently hypotensive upon initial presentation  P:  Continue Diuresis (Lasix 40mg  BID as of 3/16) Goal BP < 140/80 Toprol 25 ( half preadmission dose ) Hold Hydralazine TTE pending  RENAL A: Hypokalemia - mild P:   Replete as needed.  Trend BMP Hold Bumex  GASTROINTESTINAL A: GERD  P:   Start diet 3/12 Continue home Nexium   HEMATOLOGIC A: Coumadin coagulopathy Mild anemia VTE Px P:  Hold Coumadin Trend CBC / INR SCDs  INFECTIOUS A: Concern for possible PNA complicating hospital course. Worsening CXR, no leukocytosis or fever  P:   Check PCT  ENDOCRINE A: No active issues  P:   No intervention required  NEUROLOGIC A: SAH/SDH, small > almost fully resolved by CT scan on 3/13 Acute encephalopathy Back pain P:   Cleared for d/c by NSGY; no surgery or f/u needed PO pain control   Joneen Roach, ACNP [facility] Pulmonology/Critical Care Pager  [phone] or [phone]   04/01/2013 10:46 AM   PCCM ATTENDING: I have interviewed and examined the patient and reviewed the database. I have formulated the assessment and plan as reflected in the note above with amendments made by me.   Lots of RUE pain - Xrays ordered  Dyspneic and hypoxic despite [place] O2. Might be component of edema superimposed on very severe ILD. Not improved after diuresis. 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 Will meet with family tomorrow and address GOC. Might benefit from Palliative Care  consult  Billy Fischer, MD;  PCCM service; Mobile [phone]

## 2013-04-01 NOTE — Progress Notes (Signed)
Utilization review completed.  

## 2013-04-01 NOTE — Progress Notes (Signed)
Physical Therapy Treatment Patient Details Name: Candee Furbishlvin D Rowe MRN: [id number] DOB: 1940-12-31 Today's Date: 04/01/2013 Time: 1636-1700 PT Time Calculation (min): 24 min  PT Assessment / Plan / Recommendation  History of Present Illness 73 yo male with hx COPD, CHF, AF on Coumadin presented 3/11 with small SDH after a fall.     PT Comments   Pt OOB mobility limited this date by R UE pain. Pt frustrated regarding "lack of help". Patient making slow progress towards goals and may need ST-SNF stay prior to d/c home to achieve safe mod I function as pt currently a high falls risk with significant balance impairment.   Follow Up Recommendations  Home health PT;Supervision/Assistance - 24 hour     Does the patient have the potential to tolerate intense rehabilitation     Barriers to Discharge        Equipment Recommendations  None recommended by PT    Recommendations for Other Services    Frequency Min 4X/week   Progress towards PT Goals Progress towards PT goals:  (limited by pain)  Plan Current plan remains appropriate    Precautions / Restrictions Precautions Precautions: Fall Restrictions Weight Bearing Restrictions: No   Pertinent Vitals/Pain R elbow pain    Mobility  Bed Mobility Overal bed mobility: Needs Assistance Bed Mobility: Supine to Sit Supine to sit: Min guard;HOB elevated General bed mobility comments: pt did not want assist, labored effort, minimal use of R UE Transfers Overall transfer level: Needs assistance Transfers: Stand Pivot Transfers Stand pivot transfers: Min assist;+2 safety/equipment General transfer comment: pt unsteady, wide base of support guarded, unable to use R UE Ambulation/Gait Ambulation/Gait assistance:  (pt deferred)    Exercises     PT Diagnosis:    PT Problem List:   PT Treatment Interventions:     PT Goals (current goals can now be found in the care plan section) Acute Rehab PT Goals Patient Stated Goal: home  Visit  Information  Last PT Received On: 04/01/13 Assistance Needed: +2 (for safety ambulation) History of Present Illness: 73 yo male with hx COPD, CHF, AF on Coumadin presented 3/11 with small SDH after a fall.      Subjective Data  Subjective: pt concerned regarding "blisters"on R hip Patient Stated Goal: home   Cognition  Cognition Arousal/Alertness: Awake/alert Behavior During Therapy: WFL for tasks assessed/performed (became agitated regarding pain) Overall Cognitive Status: Within Functional Limits for tasks assessed    Balance  Balance Overall balance assessment: Needs assistance Sitting-balance support: Feet supported;Single extremity supported Sitting balance-Leahy Scale: Poor Standing balance support: Single extremity supported Standing balance-Leahy Scale: Poor Standing balance comment: lateral sway General Comments General comments (skin integrity, edema, etc.): reported "blisters" on R hip however assessed skin and no signs of skin breakdown just linen wrinkle impressions  End of Session PT - End of Session Equipment Utilized During Treatment: Gait belt;Oxygen Activity Tolerance: Patient limited by pain Patient left: in chair;with call bell/phone within reach Nurse Communication: Mobility status   GP     Marcene BrawnChadwell, Lori Popowski Marie 04/01/2013, 5:18 PM  Lewis ShockAshly Nalea Salce, PT, DPT Pager #: [phone] Office #: [phone]

## 2013-04-01 NOTE — Progress Notes (Signed)
  Echocardiogram 2D Echocardiogram has been performed.  Farrel DemarkJill Eunice, RDMS, RVT  04/01/2013, 10:19 AM

## 2013-04-01 NOTE — Progress Notes (Deleted)
Patient and his wife refuses scheduled IV Lasix. She stated that her husband doesn't need it because he is voiding  quite well. She empties pt's urinal, tells us the number of occurences but to not the amount of urine, because someone tells her it is not needed, she stated. We educated her and hope going forward we will be given the amount.Though we thank Mrs Darl HouseholderScearce for participating in her husband's care, we would like to see the color and amount of his urine output.

## 2013-04-02 ENCOUNTER — Inpatient Hospital Stay (HOSPITAL_COMMUNITY): Payer: Medicare Other

## 2013-04-02 DIAGNOSIS — J9610 Chronic respiratory failure, unspecified whether with hypoxia or hypercapnia: Secondary | ICD-10-CM

## 2013-04-02 MED ORDER — METOPROLOL SUCCINATE ER 50 MG PO TB24
50.0000 mg | ORAL_TABLET | Freq: Two times a day (BID) | ORAL | Status: DC
  Administered 2013-04-02 – 2013-04-05 (×6): 50 mg via ORAL
  Filled 2013-04-02 (×7): qty 1

## 2013-04-02 NOTE — Care Management Note (Addendum)
    Page 1 of 2   04/04/2013     2:52:34 PM   CARE MANAGEMENT NOTE 04/04/2013  Patient:  Billy Casey,Billy D   Account Number:  [id number]  Date Initiated:  04/01/2013  Documentation initiated by:  Donn PieriniWEBSTER,Teyah Rossy  Subjective/Objective Assessment:   Pt admitted with fall and SAH, Acute Resp failure  COPD CHF     Action/Plan:   PTA pt lived at home with daughter, PT eval- recommending HH- MD to meet with daughter on 3/17- ?palliative care consult   Anticipated DC Date:  04/05/2013   Anticipated DC Plan:  HOME W HOSPICE CARE      DC Planning Services  CM consult      PAC Choice  HOSPICE   Choice offered to / List presented to:  C-4 Adult Children   DME arranged  OTHER - SEE COMMENT      DME agency  [facility]        [facility]  [facility] [place]   Status of service:  In process, will continue to follow Medicare Important Message given?   (If response is "NO", the following Medicare IM given date fields will be blank) Date Medicare IM given:   Date Additional Medicare IM given:    Discharge Disposition:    Per UR Regulation:  Reviewed for med. necessity/level of care/duration of stay  If discussed at Long Length of Stay Meetings, dates discussed:   04/02/2013  04/04/2013    Comments:  04/04/13- 1030- Donn PieriniKristi Hunter Bachar RN, BSN [phone] Spoke with Delray AltMargie with [facility]- pt will need DME that includes- humidifier for home 02, AP&P mattress pad for existing hospital bed, w/c with foot rest, and overbed table. Sp[facility] with Jill Alexand[facility]Justin [facility] regarding D[facility] needs and plans for d/c on 04/05/13. Pt may need ambulance transportation home will f/u with daughter in am regarding final needs. Per Margie arrangements with hospice on target for d/c on 04/05/13  04/03/13 1540 Camellia Lucretia RoersWood, RN, BSN, [place] [phone] Billy Casey (pt daughter) [facility]. Valente DavidMargie Lester, RN notified.  Will fax H&P to [facility]  [phone]).  04/03/13 1100 Camellia Lucretia RoersWood, RN, BSN, [place] [phone] Spoke with pt daughter Billy Crocker(Tonia Casey) at bedside regarding Home Hospice options.  Provided list of Home Hospice agencies for her to choose from.  [name] wants to research agencies and will render her decission this afternoon.  Will await direction from [place].  04/02/13- 1400- Donn PieriniKristi Pablo Mathurin RN BSN [phone] Referral received for home hospice- call made to pt's daughter Billy Casey [phone]) to speak with her regarding d/c plans- confirmed plans to return home with Hospice ([facility]- explained to [place] that hospice services were different than HH and [facility]- per daughter pt already had hospital bed at home [facility] with [facility], and nebulizer- [facility]. Pt's daughter wishes to meet in person and plans to return in am around 930 - plan to meet with her at that time and will review hospice provider list with her for home hospice choice- d/c planning for thur/friday per MD notes. Referral for home hospice pending- per daughter provider choice.

## 2013-04-02 NOTE — Progress Notes (Signed)
Patient transferred from 3S via hospital bed to room 3E27. Patient on 4 liters of oxygen on arrival. Oriented patient to room and call bell system. Patient complains of no pain or discomfort. Will continue to monitor patient to end of shift.

## 2013-04-02 NOTE — Progress Notes (Signed)
PULMONARY / CRITICAL CARE MEDICINE   Name: Billy Casey MRN: [id number] DOB: 1940/02/08    ADMISSION DATE:  03/27/2013  REFERRING MD :  Newell Coral  PRIMARY SERVICE: PCCM  CHIEF COMPLAINT:  SDH  BRIEF PATIENT DESCRIPTION: 73 yo male with hx COPD, CHF, AF on Coumadin presented 3/11 with small SDH after a fall.  Seen by Neurosurgery > medical management. O2 at 2L/min at home. PCCM is asked to admit.  SIGNIFICANT EVENTS / STUDIES:  3/11 CT head >>> 9 mm focal subdural hematoma 3/13 CT head >>> Small amount of SAH, size stable. ? 1-2 mm L parietal SDH 3/16 TTE: EF 55-60%. LA moderately dilated. RA moderately dilated. Mod-severe TR 3/16 RUE plain Xrays: no fractures noted. Soft tissue swelling 3/17 Goals of care discussion: NCB in event of cardiopulmonary arrest. Hospice referral requested  LINES / TUBES:  CULTURES: 3/11 MRSA by PCR > POS  ANTIBIOTICS:    INTERVAL HISTORY: Dyspnea improved. Still with severe RUE pain.   VITAL SIGNS: Temp:  [97.6 F (36.4 C)-98.3 F (36.8 C)] 98.3 F (36.8 C) (03/17 1219) Pulse Rate:  [73-79] 79 (03/17 0900) Resp:  [11-26] 21 (03/17 0340) BP: (120-144)/(47-73) 120/47 mmHg (03/17 0900) SpO2:  [85 %-97 %] 96 % (03/17 1443)  HEMODYNAMICS:   VENTILATOR SETTINGS:   INTAKE / OUTPUT: Intake/Output     03/16 0701 - 03/17 0700 03/17 0701 - 03/18 0700   P.O. 2040    I.V. (mL/kg) 350.3 (3.2) 10 (0.1)   IV Piggyback     Total Intake(mL/kg) 2390.3 (22) 10 (0.1)   Urine (mL/kg/hr) 2800 (1.1) 225 (0.2)   Total Output 2800 225   Net -409.7 -215          PHYSICAL EXAMINATION: General: No overt dyspnea @ rest Neuro:  No focal deficits HEENT:  NSC Cardiovascular:  IRIR, no M noted Lungs:  Bibasilar crackles Abdomen:  Soft, +bs Musculoskeletal:  R elbow exquisitely tender, symmetric LE edema  LABS: I have reviewed all of today's lab results. Relevant abnormalities are discussed in the A/P section  CXR: NNF  ASSESSMENT /  PLAN:  PULMONARY A: Severe pulm fibrosis, likely IPF COPD without bronchospasm Possible component of pulm edema  P:   Cont supplemental O2 Cont SpO2 monitoring  CARDIOVASCULAR A: CAF Frequent PVCs Acute on chronic diastolic CHF H/O HTN  P:  Cont current Rx  RENAL A: Hypokalemia, resolved P:   Monitor BMET intermittently Monitor I/Os Correct electrolytes as indicated  GASTROINTESTINAL A: H/O GERD on chronic PPI P:   Cont PPI Cotn diet  HEMATOLOGIC A: Coumadin coagulopathy,resolved  No longer a candidate for long term anticoagulation due to fall risk Mild anemia without overt bleeding P:  DVT px: SCDs Monitor CBC intermittently  INFECTIOUS A: No evidence of acute infection P:   Micro and abx as above  ENDOCRINE A: No active issues  P:   No intervention required  NEUROLOGIC A: SAH/SDH, small > almost fully resolved by CT scan on 3/13 Acute encephalopathy, resolved Chronic back pain Severe RUE pain P:   Cleared for d/c by NSGY; no surgery or f/u needed PO pain control  Consider Ortho eval of R elbow 3/18  GLOBAL: Transfer to [facility] manager consult for DC planning. Pt is candidate for and desires home with Hospice. 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 Will ask PT to make recs for therapy post discharge. Will ask Social worker to assist with out of hosp DNR   Billy Fischer, MD ; PCCM  service Mobile [phone].  After 5:30 PM or weekends, call [phone]

## 2013-04-03 NOTE — Progress Notes (Signed)
Physical Therapy Treatment Patient Details Name: Billy Casey MRN: [id number] DOB: 1940-05-29 Today's Date: 04/03/2013 Time: [id number] PT Time Calculation (min): 20 min  PT Assessment / Plan / Recommendation  History of Present Illness 73 yo male with hx COPD, CHF, AF on Coumadin presented 3/11 with small SDH after a fall.     PT Comments   Pt cooperative and pleasant.  Limited by de-sat with mobility and R elbow discomfort. Educated on pursed lip breathing and PT's role re: safety.  Follow Up Recommendations  Home health PT;Supervision/Assistance - 24 hour     Does the patient have the potential to tolerate intense rehabilitation     Barriers to Discharge        Equipment Recommendations  None recommended by PT    Recommendations for Other Services    Frequency Min 4X/week   Progress towards PT Goals Progress towards PT goals: Not progressing toward goals - comment (due to de-sat and R UEdiscomfort )  Plan Current plan remains appropriate    Precautions / Restrictions Precautions Precautions: Fall Restrictions Weight Bearing Restrictions: No   Pertinent Vitals/Pain R UE pain, but didn't rate.   Mobility  Bed Mobility Overal bed mobility: Needs Assistance Bed Mobility: Supine to Sit Supine to sit: Min assist General bed mobility comments: o2 decreased to 86%on 4 L/min with supine to sit Transfers Overall transfer level: Needs assistance Equipment used: Rolling walker (2 wheeled) Transfers: Sit to/from Stand Sit to Stand: Min assist;+2 safety/equipment Stand pivot transfers: Min assist;+2 safety/equipment General transfer comment:  (shakiness noted throughout transfer with o2 to 86%on 4L) Ambulation/Gait General Gait Details: deferred gait    Exercises     PT Diagnosis:    PT Problem List:   PT Treatment Interventions:     PT Goals (current goals can now be found in the care plan section) Acute Rehab PT Goals Patient Stated Goal: home  Visit  Information  Last PT Received On: 04/03/13 Assistance Needed: +2 (for safety/ambulation) PT/OT/SLP Co-Evaluation/Treatment: Yes Reason for Co-Treatment: For patient/therapist safety History of Present Illness: 73 yo male with hx COPD, CHF, AF on Coumadin presented 3/11 with small SDH after a fall.      Subjective Data  Subjective: Per notes, pt now going home with hospice and HHPT Patient Stated Goal: home   Cognition  Cognition Arousal/Alertness: Awake/alert Behavior During Therapy: WFL for tasks assessed/performed Overall Cognitive Status: Within Functional Limits for tasks assessed    Balance  Balance Overall balance assessment: Needs assistance Sitting balance-Leahy Scale: Fair Standing balance support: Bilateral upper extremity supported Standing balance-Leahy Scale: Poor Standing balance comment: needs UE assist and shakiness throughout LE. General Comments General comments (skin integrity, edema, etc.): Pt with c/o R elbow discomfort. Redness noted.  See OT eval for specifics  End of Session PT - End of Session Equipment Utilized During Treatment: Gait belt;Oxygen Activity Tolerance: Patient limited by fatigue;Other (comment) (de-sats with any activity) Patient left: in chair;with call bell/phone within reach Nurse Communication: Mobility status   GP     [facility] HospitalMITH,[name] 04/03/2013, 12:38 PM

## 2013-04-03 NOTE — Evaluation (Signed)
Occupational Therapy Evaluation Patient Details Name: Billy Casey MRN: [id number] DOB: Dec 16, 1940 Today's Date: 04/03/2013 Time: [id number] OT Time Calculation (min): 23 min  OT Assessment / Plan / Recommendation History of present illness 73 yo male with hx COPD, CHF, AF on Coumadin presented 3/11 with small SDH after a fall.     Clinical Impression   Pt demos decline in function with ADLs and ADL mobility safety with decreased strength, balance and endurance. Pt's O2 SATs decrease during minimal activity and pt reports 8/10 R elbow pain and is not sur ewhy his R elbow is hurting. Pt unable to flex R shoulder/elbow fully due to elbow pain. Pt would benefit from acute OT services to address impairments to increase level if function and safety.   OT Assessment  Patient needs continued OT Services    Follow Up Recommendations  Home health OT;Supervision/Assistance - 24 hour    Barriers to Discharge   none  Equipment Recommendations  None recommended by OT    Recommendations for Other Services    Frequency  Min 2X/week    Precautions / Restrictions Precautions Precautions: Fall Precaution Comments: Pt denies any falls other than the 2 prior to admission Restrictions Weight Bearing Restrictions: No   Pertinent Vitals/Pain 8/10 pain "all over" per pt, 8/10 R elbow    ADL  Grooming: Performed;Wash/dry hands;Wash/dry face;Set up;Min guard Where Assessed - Grooming: Unsupported sitting Upper Body Bathing: Simulated;Set up;Min guard Where Assessed - Upper Body Bathing: Unsupported sitting Lower Body Bathing: Simulated;Maximal assistance Where Assessed - Lower Body Bathing: Unsupported sitting;Supported standing Upper Body Dressing: Performed;Set up;Min guard Where Assessed - Upper Body Dressing: Unsupported sitting Lower Body Dressing: Performed;Maximal assistance Where Assessed - Lower Body Dressing: Unsupported sitting;Unsupported standing Toilet Transfer: Minimal  assistance;Simulated (+2 physical assist for safety) Toilet Transfer Method: Sit to stand Toileting - Clothing Manipulation and Hygiene: Maximal assistance Where Assessed - Glass blower/designerToileting Clothing Manipulation and Hygiene: Standing Tub/Shower Transfer Method: Not assessed Equipment Used: Gait belt;Rolling walker Transfers/Ambulation Related to ADLs: O2 decreased to 86% on 4 L  ADL Comments: assist needed due to decreased strength, balance, R elbow pain and decreased O2 SATs with activity    OT Diagnosis: Generalized weakness;Acute pain  OT Problem List: Decreased strength;Decreased knowledge of use of DME or AE;Decreased activity tolerance;Cardiopulmonary status limiting activity;Pain;Impaired balance (sitting and/or standing) OT Treatment Interventions: Self-care/ADL training;Therapeutic exercise;Patient/family education;Neuromuscular education;Balance training;Therapeutic activities;DME and/or AE instruction   OT Goals(Current goals can be found in the care plan section) Acute Rehab OT Goals Patient Stated Goal: go home OT Goal Formulation: With patient Time For Goal Achievement: 04/10/13 Potential to Achieve Goals: Good ADL Goals Pt Will Perform Grooming: with supervision;with set-up;sitting;standing Pt Will Perform Upper Body Bathing: with set-up;with supervision;sitting Pt Will Perform Lower Body Bathing: with mod assist;with min assist;sitting/lateral leans;sit to/from stand Pt Will Perform Upper Body Dressing: with set-up;with supervision;sitting Pt Will Transfer to Toilet: with min assist;with min guard assist;ambulating;regular height toilet;grab bars;bedside commode (3 in 1 over toilet) Pt Will Perform Toileting - Clothing Manipulation and hygiene: with mod assist;with min assist;sitting/lateral leans;sit to/from stand Pt Will Perform Tub/Shower Transfer: with min guard assist;with supervision;shower seat;grab bars Additional ADL Goal #1: Pt will verbalize/demo 3/3 energy conservation  techniques during ADLs  Visit Information  Last OT Received On: 03/27/13 Assistance Needed: +2 PT/OT/SLP Co-Evaluation/Treatment: Yes Reason for Co-Treatment: For patient/therapist safety OT goals addressed during session: ADL's and self-care History of Present Illness: 73 yo male with hx COPD, CHF, AF on Coumadin presented 3/11 with  small SDH after a fall.         Prior Functioning     Home Living Family/patient expects to be discharged to:: Private residence Living Arrangements: Children Available Help at Discharge: Available 24 hours/day;Family Type of Home: Mobile home Home Access: Stairs to enter Entrance Stairs-Number of Steps: 4 Entrance Stairs-Rails: Left Home Layout: One level Home Equipment: Emergency planning/management officer - 2 wheels;Bedside commode Prior Function Level of Independence: Independent with assistive device(s) Comments: pt reports he has been very limited in his mobility secondary to SOB; reports he ambulats to bathroom without any AD and back only; pt reports he is independent with bathing and dressing Communication Communication: HOH Dominant Hand: Right         Vision/Perception Vision - History Baseline Vision: Wears glasses only for reading Patient Visual Report: No change from baseline Perception Perception: Within Functional Limits   Cognition  Cognition Arousal/Alertness: Awake/alert Behavior During Therapy: WFL for tasks assessed/performed Overall Cognitive Status: Within Functional Limits for tasks assessed    Extremity/Trunk Assessment Upper Extremity Assessment Upper Extremity Assessment: Generalized weakness;RUE deficits/detail RUE Deficits / Details: pt states that his R elbow started hurting since he's been in the hospital and he doens't know why RUE: Unable to fully assess due to pain Lower Extremity Assessment Lower Extremity Assessment: Defer to PT evaluation     Mobility Bed Mobility Overal bed mobility: Needs Assistance Bed  Mobility: Supine to Sit Supine to sit: Min assist General bed mobility comments: O2 decreased to 86%on 4 L/min with supine to sit Transfers Overall transfer level: Needs assistance Equipment used: Rolling walker (2 wheeled) Transfers: Sit to/from Stand Sit to Stand: Min assist;+2 safety/equipment Stand pivot transfers: Min assist;+2 safety/equipment General transfer comment: O2 decreased to 86% on 4 L           Balance Balance Overall balance assessment: Needs assistance Sitting-balance support: No upper extremity supported;Feet supported Sitting balance-Leahy Scale: Fair Standing balance support: Single extremity supported;Bilateral upper extremity supported;During functional activity Standing balance-Leahy Scale: Poor Standing balance comment: needs UE assist and shakiness throughout LE. General Comments General comments (skin integrity, edema, etc.): Pt with c/o R elbow discomfort. Redness noted.  See OT eval for specifics   End of Session OT - End of Session Equipment Utilized During Treatment: Gait belt;Rolling walker Activity Tolerance: Patient limited by fatigue Patient left: in chair;with call bell/phone within reach  GO     Galen Manila 04/03/2013, 1:47 PM

## 2013-04-03 NOTE — Progress Notes (Signed)
Foley cathteter inserted. Patient tolerated it without any difficulty. Foley catheter secured with Stat-Lock device and is connected to standard drainage bag, dated with the time and initialed. Patient stated he has some relief. Will continue to monitor patient to end of shift.

## 2013-04-03 NOTE — Progress Notes (Signed)
PULMONARY / CRITICAL CARE MEDICINE   Name: Billy Casey MRN: [id number] DOB: June 20, 1940    ADMISSION DATE:  03/27/2013  REFERRING MD :  Billy Casey  PRIMARY SERVICE: PCCM  CHIEF COMPLAINT:  SDH  BRIEF PATIENT DESCRIPTION: 73 yo male with hx COPD, CHF, AF on Coumadin presented 3/11 with small SDH after a fall.  Seen by Neurosurgery > medical management. O2 at 2L/min at home. PCCM is asked to admit.  SIGNIFICANT EVENTS / STUDIES:  3/11 CT head >>> 9 mm focal subdural hematoma 3/13 CT head >>> Small amount of SAH, size stable. ? 1-2 mm L parietal SDH 3/16 TTE: EF 55-60%. LA moderately dilated. RA moderately dilated. Mod-severe TR 3/16 RUE plain Xrays: no fractures noted. Soft tissue swelling 3/17 Goals of care discussion: NCB in event of cardiopulmonary arrest. Hospice referral requested  LINES / TUBES:  CULTURES: 3/11 MRSA by PCR > POS  ANTIBIOTICS:  INTERVAL HISTORY: Complains of pain to R arm/elbow and "ache all over". Feels as though his pain management is inadequate, not frequent enough.  DOE, which family states is his baseline. Breathing comfortably on 4L/min.  VITAL SIGNS: Temp:  [97.5 F (36.4 C)-98.3 F (36.8 C)] 97.8 F (36.6 C) (03/18 0510) Pulse Rate:  [40-79] 77 (03/18 0510) Resp:  [24] 24 (03/18 0510) BP: (118-138)/(47-99) 124/72 mmHg (03/18 0510) SpO2:  [92 %-96 %] 93 % (03/18 0510) Weight:  [107.9 kg (237 lb 14 oz)-108.8 kg (239 lb 13.8 oz)] 107.9 kg (237 lb 14 oz) (03/18 0510)  HEMODYNAMICS:   VENTILATOR SETTINGS:   INTAKE / OUTPUT: Intake/Output     03/17 0701 - 03/18 0700 03/18 0701 - 03/19 0700   P.O.     I.V. (mL/kg) 10 (0.1)    Total Intake(mL/kg) 10 (0.1)    Urine (mL/kg/hr) 1025 (0.4)    Total Output 1025     Net -1015            PHYSICAL EXAMINATION: General: No overt dyspnea @ rest Neuro:  No focal deficits HEENT: Billy Casey, no JVD noted Cardiovascular:  RRR Lungs:  Bibasilar crackles Abdomen:  Soft, +bs Musculoskeletal:  R elbow  exquisitely tender, symmetric LE edema  LABS: I have reviewed all of today's lab results. Relevant abnormalities are discussed in the A/P section  CXR: NNF  ASSESSMENT / PLAN:  PULMONARY A: Severe pulm fibrosis, likely IPF COPD without bronchospasm Possible component of pulm edema  P:   Cont supplemental O2 Cont SpO2 monitoring  CARDIOVASCULAR A: CAF Frequent PVCs Acute on chronic diastolic CHF H/O HTN  P:  Cont current Rx  RENAL A: Hypokalemia, resolved P:   Monitor BMET intermittently Monitor I/Os Correct electrolytes as indicated  GASTROINTESTINAL A: H/O GERD on chronic PPI P:   Cont PPI Cont diet  HEMATOLOGIC A: Coumadin coagulopathy,resolved  No longer a candidate for long term anticoagulation due to fall risk Mild anemia without overt bleeding P:  DVT px: SCDs Monitor CBC intermittently  INFECTIOUS A: No evidence of acute infection P:   Micro and abx as above  ENDOCRINE A: No active issues  P:   No intervention required  NEUROLOGIC A: SAH/SDH, small > almost fully resolved by CT scan on 3/13 Acute encephalopathy, resolved Chronic back pain Severe RUE pain P:   Cleared for d/c by NSGY; no surgery or f/u needed PO pain control, consider increasing Ortho consult for of R elbow 3/18  GLOBAL: Family meeting with case management today to discuss home hospice.  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 PT consult for [facility] needs pending. Social work consult for out of hospital DNR pending. Expect discharge to home hospice likely next 24/48 hours.    Joneen Roach, ACNP [facility] Pager [phone] or [phone]   PCCM ATTENDING: I have interviewed and examined the patient and reviewed the database. I have formulated the assessment and plan as reflected in the note above with amendments made by me.   Billy Fischer, MD;  PCCM service; Mobile [phone]

## 2013-04-03 NOTE — Progress Notes (Signed)
Pt having difficulty voiding. Has tried on two attempts with second attempt pt states were just a few drops. Performed bladder scan and result was 500cc. Paged oncall doctor, Dr. Darrick Pennaeterding. Orders given to insert foley catheter. Will carry out as ordered and continue to monitor patient to end of shift.

## 2013-04-03 NOTE — Progress Notes (Signed)
eLink Physician-Brief Progress Note Patient Name: Candee Furbishlvin D Hallums DOB: May 09, 1940 MRN: [id number]  Date of Service  04/03/2013   HPI/Events of Note  Patient's foley d/ced upon transfer from stepdown to floor.  Inability to void with greater than 500 cc noted on bladder scan.   eICU Interventions  Plan: Insert foley to straight drain   Intervention Category Intermediate Interventions: Oliguria - evaluation and management  Ruben Pyka 04/03/2013, 3:46 AM

## 2013-04-04 ENCOUNTER — Telehealth: Payer: Self-pay | Admitting: Family

## 2013-04-04 ENCOUNTER — Encounter (HOSPITAL_COMMUNITY): Payer: Medicare Other

## 2013-04-04 DIAGNOSIS — M79609 Pain in unspecified limb: Secondary | ICD-10-CM

## 2013-04-04 LAB — CBC
HEMATOCRIT: 30.1 % — AB (ref 39.0–52.0)
HEMOGLOBIN: 10.1 g/dL — AB (ref 13.0–17.0)
MCH: 32.7 pg (ref 26.0–34.0)
MCHC: 33.6 g/dL (ref 30.0–36.0)
MCV: 97.4 fL (ref 78.0–100.0)
Platelets: 254 10*3/uL (ref 150–400)
RBC: 3.09 MIL/uL — ABNORMAL LOW (ref 4.22–5.81)
RDW: 15.9 % — AB (ref 11.5–15.5)
WBC: 10 10*3/uL (ref 4.0–10.5)

## 2013-04-04 LAB — BASIC METABOLIC PANEL
BUN: 10 mg/dL (ref 6–23)
CO2: 27 mEq/L (ref 19–32)
CREATININE: 0.61 mg/dL (ref 0.50–1.35)
Calcium: 8.8 mg/dL (ref 8.4–10.5)
Chloride: 89 mEq/L — ABNORMAL LOW (ref 96–112)
GFR calc Af Amer: >90 mL/min (ref >90)
GFR calc non Af Amer: >90 mL/min (ref >90)
Glucose, Bld: 103 mg/dL — ABNORMAL HIGH (ref 70–99)
Potassium: 5.3 mEq/L (ref 3.7–5.3)
Sodium: 128 mEq/L — ABNORMAL LOW (ref 137–147)

## 2013-04-04 MED ORDER — MORPHINE SULFATE ER 15 MG PO TBCR: 15.0000 mg | EXTENDED_RELEASE_TABLET | Freq: Two times a day (BID) | ORAL | Status: DC

## 2013-04-04 MED ORDER — MORPHINE SULFATE ER 15 MG PO TBCR
15.0000 mg | EXTENDED_RELEASE_TABLET | Freq: Two times a day (BID) | ORAL | Status: DC
  Administered 2013-04-04 – 2013-04-05 (×3): 15 mg via ORAL
  Filled 2013-04-04 (×4): qty 1

## 2013-04-04 MED ORDER — DIPHENHYDRAMINE HCL 25 MG PO CAPS
25.0000 mg | ORAL_CAPSULE | Freq: Four times a day (QID) | ORAL | Status: DC | PRN
  Administered 2013-04-04 (×2): 25 mg via ORAL
  Filled 2013-04-04 (×2): qty 1

## 2013-04-04 NOTE — Progress Notes (Signed)
Right upper extremity venous duplex:  No evidence of DVT or superficial thrombosis.    

## 2013-04-04 NOTE — Progress Notes (Signed)
Spoke with Billy RoachPaul Hoffman, NP in regards to patients daughter, Billy Casey, request to have patient evaluated for neuropathy. Also asked Renae Fickleaul [facility] completed for [facility]. Renae Fickleaul states he we be on unit a little later today.

## 2013-04-04 NOTE — Progress Notes (Signed)
PULMONARY / CRITICAL CARE MEDICINE   Name: Billy Casey MRN: [id number] DOB: [birth year]    ADMISSION DATE:  03/27/2013  REFERRING MD :  Sherwood Gambler  PRIMARY SERVICE: PCCM  CHIEF COMPLAINT:  SDH  BRIEF PATIENT DESCRIPTION: 73 yo male with hx COPD, CHF, AF on Coumadin presented 3/11 with small SDH after a fall.  Seen by Neurosurgery > medical management. O2 at 2L/min at home. PCCM is asked to admit.  SIGNIFICANT EVENTS / STUDIES:  3/11 CT head >>> 9 mm focal subdural hematoma 3/13 CT head >>> Small amount of SAH, size stable. ? 1-2 mm L parietal SDH 3/16 TTE: EF 55-60%. LA moderately dilated. RA moderately dilated. Mod-severe TR 3/16 RUE plain Xrays: no fractures noted. Soft tissue swelling 3/17 Goals of care discussion: NCB in event of cardiopulmonary arrest. Hospice referral requested  LINES / TUBES:  CULTURES: 3/11 MRSA by PCR > POS  ANTIBIOTICS:  INTERVAL HISTORY: Pain in the right upper extremity still remains his chief complaint at this point. Also complains of pain all over, itching of head and lower extremities.   VITAL SIGNS: Temp:  [97.3 F (36.3 C)-98.3 F (36.8 C)] 98.3 F (36.8 C) (03/19 0422) Pulse Rate:  [58-81] 58 (03/19 0422) Resp:  [17-22] 17 (03/19 0422) BP: (110-143)/(55-80) 124/59 mmHg (03/19 0422) SpO2:  [94 %-98 %] 98 % (03/19 0801) Weight:  [109.4 kg (241 lb 2.9 oz)] 109.4 kg (241 lb 2.9 oz) (03/19 0422)  HEMODYNAMICS:   VENTILATOR SETTINGS:   INTAKE / OUTPUT: Intake/Output     03/18 0701 - 03/19 0700 03/19 0701 - 03/20 0700   P.O. 840    I.V. (mL/kg)     Total Intake(mL/kg) 840 (7.7)    Urine (mL/kg/hr) 1475 (0.6)    Total Output 1475     Net -635            PHYSICAL EXAMINATION: General: No overt dyspnea @ rest Neuro:  No focal deficits HEENT: [place], no JVD noted Cardiovascular:  RRR Lungs:  Bibasilar crackles Abdomen:  Soft, +bs Musculoskeletal:  R elbow exquisitely tender, warm, edematous. symmetric LE edema  LABS: I have  reviewed all of today's lab results. Relevant abnormalities are discussed in the A/P section  CXR: NNF  ASSESSMENT / PLAN:  Severe pulm fibrosis, likely IPF COPD without bronchospasm Possible component of pulm edema CAF Frequent PVCs Acute on chronic diastolic CHF H/O HTN  Hypokalemia, resolved H/O GERD on chronic PPI Coumadin coagulopathy,resolved  Mild anemia without overt bleeding SAH/SDH, small > almost fully resolved by CT scan on 3/13 Chronic back pain Severe RUE pain  P:   Cont supplemental O2, wean as tolerated to keep SpO2 88-96% Cont SpO2 monitoring Was on 2L/min PRN at home, will likely be d/c on 4L/min continuous.  PPI No longer a candidate for long term anticoagulation due to fall risk Add scheduled MS contin 15 mg. PRN oxycodone Ortho consult for of R elbow pending Doppler RUE to r/o dvt   GLOBAL: Home hospice location has been chosen and initiation is in progress. PT recommends home health PT and 24 hour supervision. OT recommends home health OT. Social work consult for out of hospital DNR pending. Expect discharge tomorrow if hospice and home health needs are met by that time.   Georgann Housekeeper, ACNP [facility] Pager [phone] or [phone]   PCCM ATTENDING: I have interviewed and examined the patient and reviewed the database. I have formulated the assessment and plan as reflected in the note  above with amendments made by me.   Possible DC home 3/20  Merton Border, MD;  PCCM service; Mobile [phone]

## 2013-04-04 NOTE — Progress Notes (Signed)
Utilization review completed.  

## 2013-04-04 NOTE — Progress Notes (Signed)
Physical Therapy Treatment Patient Details Name: Billy Casey MRN: [id number] DOB: 02/22/1940 Today's Date: 04/04/2013 Time: [id number] PT Time Calculation (min): 28 min  PT Assessment / Plan / Recommendation  History of Present Illness 73 yo male with hx COPD, CHF, AF on Coumadin presented 3/11 with small SDH after a fall.     PT Comments   Pt with family present on arrival and confused about the location of his RW. Pt able perform gait and HEP today with cueing and assist. Pt not very responsive to cues and education for gait and transfer technique and requires increased time with gait. Pt encouraged to continue HEP and OOB daily. Will continue to follow. Confirmed with pt that he has ramp to enter home and will have 24hr supervision. Pt will need WC for functional mobility in home and community.   Follow Up Recommendations  Home health PT;Supervision/Assistance - 24 hour     Does the patient have the potential to tolerate intense rehabilitation     Barriers to Discharge        Equipment Recommendations  Wheelchair (measurements PT)    Recommendations for Other Services    Frequency Min 3X/week   Progress towards PT Goals Progress towards PT goals: Progressing toward goals  Plan Current plan remains appropriate;Frequency needs to be updated    Precautions / Restrictions Precautions Precautions: None Precaution Comments: watch O2 sats   Pertinent Vitals/Pain sats drop to 85% with gait on 3L and return to 93% after 2 min seated rest with cues for pursed lip breathing HR 71-81 Pt continues to report pain in Right elbow but unrated     Mobility  Bed Mobility Overal bed mobility: Needs Assistance Bed Mobility: Supine to Sit Supine to sit: Min assist General bed mobility comments: cueing and assist to pivot legs to EOB and elevate trunk Transfers Overall transfer level: Needs assistance Transfers: Sit to/from Stand Sit to Stand: Min guard General transfer comment: cues  for hand placement and safety with pt impulsive on standing prior to P.T. ready Ambulation/Gait Ambulation/Gait assistance: Min assist Ambulation Distance (Feet): 22 Feet Assistive device: Rolling walker (2 wheeled) Gait Pattern/deviations: Step-through pattern;Trunk flexed Gait velocity interpretation: <1.8 ft/sec, indicative of risk for recurrent falls General Gait Details: pt with flexed trunk and attempted cueing pt repeatedly for extending trunk and stepping into RW but pt would state "just be quiet I'm concentrating"    Exercises General Exercises - Lower Extremity Long Arc Quad: AROM;Seated;Both;10 reps Hip ABduction/ADduction: AROM;Seated;Both;10 reps Hip Flexion/Marching: AROM;Seated;Both;10 reps Toe Raises: AROM;Seated;Both;10 reps Heel Raises: AROM;Seated;Both;10 reps   PT Diagnosis:    PT Problem List:   PT Treatment Interventions:     PT Goals (current goals can now be found in the care plan section)    Visit Information  Last PT Received On: 04/04/13 Assistance Needed: +1 History of Present Illness: 73 yo male with hx COPD, CHF, AF on Coumadin presented 3/11 with small SDH after a fall.      Subjective Data      Cognition  Cognition Arousal/Alertness: Awake/alert Behavior During Therapy: Flat affect Overall Cognitive Status: Impaired/Different from baseline Area of Impairment: Memory;Attention Current Attention Level: Sustained Memory: Decreased short-term memory    Balance     End of Session PT - End of Session Equipment Utilized During Treatment: Gait belt;Oxygen Activity Tolerance: Patient limited by fatigue Patient left: in chair;with call bell/phone within reach;with family/visitor present Nurse Communication: Mobility status   GP     Toney Sangabor, Trevino Wyatt  Beth 04/04/2013, 1:09 PM Delaney Meigs, PT [phone]

## 2013-04-04 NOTE — Progress Notes (Signed)
Chaplain was given consult to meet with pt from nursing director.  Pt was alert and responsive.  Chaplain and pt talked for some time regarding his health, family, and faith.  Chaplain provided emotional support and active reflective listening.  Pt seemed to appreciate the chaplain visit.

## 2013-04-04 NOTE — Telephone Encounter (Signed)
Spoke with Evette to advise that Billy Casey is pt's PCP and that Dr. Caryl NeverBurchette is he attending physician. Also advised that Hospice physicians may provide care for pt while he is there. Evette states that she will let hospital know

## 2013-04-04 NOTE — Consult Note (Signed)
NAME: Billy Casey MRN:   [id number] DOB:   1940-04-10     HISTORY AND PHYSICAL  CHIEF COMPLAINT:  right elbow pain  HISTORY:   Billy Casey a 73 y.o. male  with right  Elbow Pain Patient complains of right elbow pain. Onset of the symptoms was several days ago. Inciting event: none known. Current symptoms include: point tenderness lateral epicondyle. Pain is aggravated by: nothing in particular. Symptoms have gradually worsened. Patient has had no prior elbow problems. Evaluation to date: plain films, which were normal. Treatment to date: ice and OTC analgesics. PAST MEDICAL HISTORY:   Past Medical History  Diagnosis Date  . COPD (chronic obstructive pulmonary disease)   . CHF (congestive heart failure)   . Hypertension   . Atrial fibrillation   . Interstitial lung disease   . Pacemaker   . Chest pain   . Dyspnea on exertion     PAST SURGICAL HISTORY:   Past Surgical History  Procedure Laterality Date  . Cholecystectomy    . Permanent pacemaker insertion      MEDICATIONS:   Medications Prior to Admission  Medication Sig Dispense Refill  . bumetanide (BUMEX) 2 MG tablet Take 1 tablet (2 mg total) by mouth daily.  90 tablet  0  . esomeprazole (NEXIUM) 40 MG capsule Take 40 mg by mouth daily before breakfast.      . hydrALAZINE (APRESOLINE) 25 MG tablet Take 25 mg by mouth 3 (three) times daily.      Marland Kitchen ipratropium-albuterol (DUONEB) 0.5-2.5 (3) MG/3ML SOLN Take 3 mLs by nebulization every 4 (four) hours as needed (shortness of breath or wheeze).      . metoprolol succinate (TOPROL-XL) 50 MG 24 hr tablet Take 50 mg by mouth 2 (two) times daily. Take with or immediately following a meal.      . potassium chloride SA (K-DUR,KLOR-CON) 20 MEQ tablet Take 40 mEq by mouth daily.      . sertraline (ZOLOFT) 50 MG tablet Take 1 tablet (50 mg total) by mouth daily.  90 tablet  0  . warfarin (COUMADIN) 5 MG tablet Take 7.5 mg by mouth daily.        ALLERGIES:   Allergies   Allergen Reactions  . Penicillins Other (See Comments)    Reaction unknown    REVIEW OF SYSTEMS:   Negative except hpi  FAMILY HISTORY:   Family History  Problem Relation Age of Onset  . Stroke Mother   . CAD Father   . Breast cancer Mother     SOCIAL HISTORY:   reports that he quit smoking about 36 years ago. His smoking use included Cigarettes. He has a 1.5 pack-year smoking history. His smokeless tobacco use includes Chew. He reports that he drinks alcohol. He reports that he does not use illicit drugs.  PHYSICAL EXAM:  General appearance: alert, cooperative and no distress Resp: clear to auscultation bilaterally Cardio: regular rate and rhythm, S1, S2 normal, no murmur, click, rub or gallop GI: soft, non-tender; bowel sounds normal; no masses,  no organomegaly Extremities: Homans sign is negative, no sign of DVT Pulses: 2+ and symmetric Skin: Skin color, texture, turgor normal. No rashes or lesions Incision/Wound:    LABORATORY STUDIES:  Recent Labs  04/04/13 0515  WBC 10.0  HGB 10.1*  HCT 30.1*  PLT 254     Recent Labs  04/04/13 0515  NA 128*  K 5.3  CL 89*  CO2 27  GLUCOSE 103*  BUN 10  CREATININE 0.61  CALCIUM 8.8    STUDIES/RESULTS:  Dg Chest 1 View  03/27/2013   CLINICAL DATA Short of breath  EXAM CHEST - 1 VIEW  COMPARISON DG CHEST 2 VIEW dated 03/11/2013; CT CHEST W/CM dated 07/03/2012  FINDINGS There are bilateral patchy interstitial and alveolar airspace opacities more confluent in the right upper lobe. There is no pleural effusion or pneumothorax. Stable cardiomegaly. Single lead cardiac pacer. Unremarkable osseous structures.  IMPRESSION Bilateral patchy interstitial and alveolar airspace opacities more confluent in the right upper lobe. There is likely an element of underlying chronic interstitial lung disease. The appearance is concerning for superimposed mild interstitial edema or atypical infection.  SIGNATURE  Electronically Signed   By: Elige Ko   On: 03/27/2013 12:34   Dg Chest 2 View  03/11/2013   CLINICAL DATA:  Shortness of breath, chest pain.  Cough.  EXAM: CHEST  2 VIEW  COMPARISON:  DG CHEST 2 VIEW dated 07/05/2012; CT CHEST W/CM dated 07/03/2012; DG CHEST 1V PORT dated 07/02/2012  FINDINGS: Persistent severe interstitial prominence noted. This is consist with chronic interstitial lung disease/ fibrosis. Active interstitial lung disease including pneumonitis cannot be excluded. Also there is cardiomegaly. A component of congestive heart failure and pulmonary interstitial edema should also be considered. No pleural effusion. No pneumothorax. Cardiac pacer noted with lead tip projected over the right ventricle. Degenerative changes thoracic spine.  IMPRESSION: 1. Persistent severe interstitial lung disease. Although these changes are most likely related chronic interstitial fibrosis active pneumonitis cannot be excluded. 2. Severe cardiomegaly. A component of congestive heart failure and interstitial edema should be considered as well.   Electronically Signed   By: Maisie Fus  Register   On: 03/11/2013 16:04   Dg Lumbar Spine Complete  03/27/2013   CLINICAL DATA Fall.  History of L1 fracture  EXAM LUMBAR SPINE - COMPLETE 4+ VIEW  COMPARISON None.  FINDINGS Moderate to severe fracture of L1 which is most likely chronic, specially given the history supporting this.  Mild depression of the superior endplate of L3 which could be due to a recent or chronic fracture.  Disc degeneration and mild spurring L4-5 and L5-S1. No pars defect or mass.  IMPRESSION Moderate to severe compression fracture of L1 which appears chronic  Mild fracture of L3 which could be acute or chronic. MRI would be helpful to evaluate for bone marrow edema and acute fracture.  SIGNATURE  Electronically Signed   By: Marlan Palau M.D.   On: 03/27/2013 14:53   Dg Shoulder Right  04/02/2013   CLINICAL DATA:  Recent fall.  Pain.  EXAM: RIGHT SHOULDER - 2+ VIEW  COMPARISON:  Humerus  and forearm radiographs same day. Chest radiograph 04/01/2013.  FINDINGS: The humeral head is located. No acute fracture or focal bony abnormality is identified. There are degenerative changes of the acromioclavicular joint. Marked interstitial prominence/coarsening is noted in the visualized portion of the right lung, as described on yesterday's chest radiograph.  IMPRESSION: No acute osseous abnormality. Degenerative changes of the acromioclavicular joint.   Electronically Signed   By: Britta Mccreedy M.D.   On: 04/02/2013 08:49   Dg Forearm Right  04/02/2013   CLINICAL DATA:  Fall.  Pain.  EXAM: RIGHT FOREARM - 2 VIEW  COMPARISON:  Right humerus shoulder radiographs 03/05/2013  FINDINGS: The radius and ulna are intact. Benign appearing 2 mm focal area of sclerosis within the midshaft of the ulna. Extensive peripheral arterial vascular calcifications. Mild soft tissue swelling of the  distal forearm adjacent to the distal ulna.  IMPRESSION: 1. No acute bony abnormality identified. 2. Small area of focal soft tissue swelling adjacent to the distal ulna. 3. Peripheral vascular atherosclerotic calcification.   Electronically Signed   By: Britta Mccreedy M.D.   On: 04/02/2013 08:52   Ct Head Wo Contrast  03/29/2013   CLINICAL DATA:  Fall with head trauma and intracranial hemorrhage.  EXAM: CT HEAD WITHOUT CONTRAST  TECHNIQUE: Contiguous axial images were obtained from the base of the skull through the vertex without intravenous contrast.  COMPARISON:  03/27/2013  FINDINGS: The study suffers from considerable motion degradation. Small focus of subarachnoid hemorrhage at the inferior surface of the falx is no larger, and is probably becoming less distinct. One could question if there is a very minimal (1-2 mm) subdural hematoma along the left posterior parietal convexity. This is difficult to state with certainty given the motion. No large subdural. No additional bleeding is seen. The brain shows mild atrophy and  chronic small vessel change as seen previously. Left parietal scalp hematoma appear similar. No skull fracture. No fluid in the sinuses. No hydrocephalus.  IMPRESSION: Significant motion degradation.  Small amount of subarachnoid blood just inferior to the central falx is becoming less distinct. No increasing subarachnoid blood.  Left parietal scalp hematoma.  Question very thin (1-2 mm) subdural hematoma along the left parietal convexity. Difficult to state with certainty because of motion.   Electronically Signed   By: Paulina Fusi M.D.   On: 03/29/2013 07:48   Ct Head Wo Contrast  03/27/2013   CLINICAL DATA Patient fell walking to the car and can't remember the fall, no loss of consciousness reported by a witness, dizziness  EXAM CT HEAD WITHOUT CONTRAST  TECHNIQUE Contiguous axial images were obtained from the base of the skull through the vertex without intravenous contrast.  COMPARISON None.  FINDINGS No skull fracture. There is a moderate scalp hematoma posteriorly over the left parieto-occipital region. There is mild to moderate diffuse atrophy and mild low attenuation in the deep white matter. No evidence of vascular territory infarct. There is a 9 mm oval focus of hyperattenuation in the falx centrally above the level of the ventricles consistent with a tiny subdural hematoma. There is no other evidence of extra-axial fluid and there is no parenchymal hematoma identified. There is no hydrocephalus.  IMPRESSION 9 mm focal subdural hematoma. Critical Value/emergent results were called by telephone at the time of interpretation on 03/27/2013 at 2:33 PM to Dr. Blake Divine , who verbally acknowledged these results.  SIGNATURE  Electronically Signed   By: Esperanza Heir M.D.   On: 03/27/2013 14:33   Dg Chest Port 1 View  04/01/2013   CLINICAL DATA:  Respiratory failure.  EXAM: PORTABLE CHEST - 1 VIEW  COMPARISON:  03/28/2013 and 07/03/2012  FINDINGS: Stable appearance of the left cardiac single lead  pacemaker. There are diffuse interstitial lung densities throughout both lungs. There is mild sparing of the interstitial disease in the left lung apex region. Heart size remains enlarged. The parenchymal lung disease has not significantly changed since the prior examination. Negative for a pneumothorax.  IMPRESSION: Diffuse parenchymal lung densities suggest chronic interstitial lung changes. It is difficult to exclude acute on chronic disease. There has been minimal change from the previous examination.   Electronically Signed   By: Richarda Overlie M.D.   On: 04/01/2013 07:41   Dg Chest Port 1 View  03/28/2013   CLINICAL DATA:  Acute  shortness of breath.  EXAM: PORTABLE CHEST - 1 VIEW  COMPARISON:  03/27/2013  FINDINGS: Irregular interstitial and patchy airspace opacities are distributed in a heterogeneous distribution in the lungs, more prominent in the right mid lung and in the left mid and lower lung. Allowing for differences in technique and patient positioning, findings are stable from the previous day's study.  No convincing pleural effusion.  No pneumothorax.  Cardiac silhouette is mildly enlarged no mediastinal or hilar masses. Single lead pacemaker is stable in well positioned.  IMPRESSION: 1. No significant change from the previous day's study. 2. Bilateral arm opacities described above are mostly due to chronic interstitial disease based on more remote exams. Superimposed infectious or inflammatory infiltrate, however, should be considered likely in the proper clinical setting. No convincing edema.   Electronically Signed   By: Amie Portlandavid  Ormond M.D.   On: 03/28/2013 19:12   Dg Humerus Right  04/02/2013   CLINICAL DATA:  Recent fall.  Right arm pain.  EXAM: RIGHT HUMERUS - 2+ VIEW  COMPARISON:  Right shoulder and right forearm radiographs  FINDINGS: There is suggestion is some subcutaneous stranding in the soft tissues of the level of the distal humerus, near the elbow. No acute fracture or suspicious bony  lesion is seen.  IMPRESSION: Mild soft tissue swelling/bruising at the level of the distal humerus. No acute bony abnormality.   Electronically Signed   By: Britta MccreedySusan  Turner M.D.   On: 04/02/2013 08:46    ASSESSMENT: left elbow pain        Active Problems:   Postinflammatory pulmonary fibrosis   Subarachnoid bleed   Subdural hematoma   SDH (subdural hematoma)   Diastolic CHF, chronic   COPD (chronic obstructive pulmonary disease)   Chronic respiratory failure    PLAN: continue conservative treatment, talked to nurse and physical therapy about the importance of keeping the elbow moving   Venesa Semidey 04/04/2013. 1:23 PM

## 2013-04-04 NOTE — Progress Notes (Signed)
[facility], patient and family request services of [facility] after discharge.   Patient information reviewed with Billy Casey, [facility] Medical Director hospice eligible with dx: IPF (516.3) .  Spoke at bedside this morning- with pt, dtr Billy Casey [phone]) and s-i-l Billy Casey [phone]) to initiate education related to hospice services, philosophy and team approach to care, family voiced good understanding of information provided. Pulmonary NP Billy Casey arrived in room during discussion and the following issues were raised by the family: 1) R arm/shoulder/elbow pain pt rated current pain as 8/10 currently on OxyIR 10 mg q 4 hr PRN, pt states this medication helps 'for a short time'- uncertain if any other modalities will help (ice/warm packs)- attending team to have Ortho evaluate; attending team to address recommendations for pain management at discharge  2) pt c/o itching at scalp scabbed areas ? Need for dry shampoo and/or ointment  3) pt has Foley catheter in place  R/t urinary retention - discussed with pt/ possibly keeping this in for comfort at d/c -  4) increased weakness - pt unsure if he will be able to transport by personal vehicle home - s-i-l off work today - if pt to d/c tomorrow may request non-emergent transport  5) Please complete GOLD DNR form in shadow chart and send home with pt Attending team to follow up -Discharge when all arrangements are in place- possibly tomorrow per discussion  DME needs discussed - [facility], O2 concentrator and neb machine - pt will now require O2 @ 4LNC continuous which is and increase from previous needs Family request- 1)  AP&P mattress pad to go over existing bed;  -2)  request O2 humidifier for concentrator;  3) lightweight wheel chair;  And 4) over-bed table * Please contact dtr Billy Casey [phone] or sil Billy Casey [phone] to aarrange  delivery; address in EPIC is correct CMRN to notify [facility] representative regarding equipment needs [facility]  Please notify [facility] when patient is ready to leave unit at d/c call [phone] (or [phone] if after 5 pm);  [facility] information and contact numbers also given to dtr Billy Casey during visit.   Above information shared with [facility] C[facility] Please call with any questions or concerns   Valente DavidMargie Maylon Sailors, RN 04/04/2013, 10:02 AM [facility] RN Liaison [phone]

## 2013-04-04 NOTE — Telephone Encounter (Signed)
Billy Casey needs to know if you will be the attending for pt.  He is being discharged tomorrow from the hospital.  She needs a callback.

## 2013-04-05 DIAGNOSIS — I6200 Nontraumatic subdural hemorrhage, unspecified: Secondary | ICD-10-CM

## 2013-04-05 MED ORDER — IPRATROPIUM-ALBUTEROL 0.5-2.5 (3) MG/3ML IN SOLN: 3.0000 mL | RESPIRATORY_TRACT | Status: DC | PRN

## 2013-04-05 MED ORDER — OXYCODONE HCL 5 MG PO TABS: ORAL_TABLET | ORAL | Status: DC

## 2013-04-05 MED ORDER — MORPHINE SULFATE ER 15 MG PO TBCR: 15.0000 mg | EXTENDED_RELEASE_TABLET | Freq: Two times a day (BID) | ORAL | Status: DC

## 2013-04-05 NOTE — Discharge Summary (Signed)
Physician Discharge Summary       Patient ID: Billy Casey MRN: [id number] DOB/AGE: Feb 24, 1940 73 y.o.  Admit date: 03/27/2013 Discharge date: 04/05/2013  Discharge Diagnoses:  Active Problems:   Postinflammatory pulmonary fibrosis   Subarachnoid bleed   Subdural hematoma   SDH (subdural hematoma)   Diastolic CHF, chronic   COPD (chronic obstructive pulmonary disease)   Chronic respiratory failure   Detailed Hospital Course:   73 yo male with hx COPD, AFib on coumadin, CHF who sustained mechanical fall 3/11, hitting the back of his head. CT revealed small SDH. Pt is awake and oriented, c/o headache and back pain. Denies LOC, blurry vision, lightheadedness, chest pain. Does c/o mild SOB above baseline. His mental status remained stable, but he continued to have hypoxemia. It did not appear that this was AECOPD, however ATX was noted on CXR. His SOB at rest resolved on 4L/min O2. However with minimal exertion he would desaturate. 3/14 he was moved out of ICU to SDU, and then 3/16 to the medical ward. While there his respiratory status deteriorated a bit and he was hypoxemic on 4L at rest. He improved with diuresis. 3/17 patient was considered as a candidate for home hospice. He and his family were supportive of this idea. They met with the care manager and home hospice was chosen as the method of care. 3/18 his main complaint was elbow pain, with additional pain "all over". He was prescribed scheduled pain medication. 3/20 he is a candidate for discharge.    Discharge Plan by diagnoses  Severe pulm fibrosis, likely IPF  COPD without bronchospasm   CAF  Acute on chronic diastolic CHF  HTN  Hypokalemia, resolved  H/O GERD on chronic PPI  Coumadin coagulopathy, resolved  SAH/SDH, small > almost fully resolved by CT scan on 3/13  Chronic back pain  Severe RUE pain  Discharge Plan: Supplemental O2 at 4L/min continuous, wean as tolerated to keep SpO2 88-96%  Humidified O2 Resume  Nexium Resume Bumex, Potassium D/c coumadin given fall risk  Scheduled MS contin 15 mg BID.  PRN oxycodone for breakthrough pain Conservative management of R elbow pain, mobilize [facility] PT/OT Senna for opioid related constipation.   Office F/u:  Will need BMP to assess K after resuming bumex and k tablets.     Significant Hospital tests/ studies/ interventions and procedures   SIGNIFICANT EVENTS / STUDIES:  3/11 CT head >>> 9 mm focal subdural hematoma  3/13 CT head >>> Small amount of SAH, size stable. ? 1-2 mm L parietal SDH  3/16 TTE: EF 55-60%. LA moderately dilated. RA moderately dilated. Mod-severe TR  3/16 RUE plain Xrays: no fractures noted. Soft tissue swelling  3/17 Goals of care discussion: NCB in event of cardiopulmonary arrest. Hospice referral requested   CULTURES:  3/11 MRSA by PCR > POS  Consults Ortho Neurosurgery  Discharge Exam: BP 159/83  Pulse 58  Temp(Src) 97.8 F (36.6 C) (Oral)  Resp 18  Ht 6' 3"  (1.905 m)  Wt 109 kg (240 lb 4.8 oz)  BMI 30.04 kg/m2  SpO2 95%  General: No dyspnea @ rest  Neuro: No focal deficits  HEENT: [place], no JVD noted  Cardiovascular: RRR  Lungs: Bibasilar crackles  Abdomen: Soft, +bs  Musculoskeletal: R elbow tenderness, chronic back pain.    Labs at discharge Lab Results  Component Value Date   CREATININE 0.61 04/04/2013   BUN 10 04/04/2013   NA 128* 04/04/2013   K 5.3 04/04/2013   CL  89* 04/04/2013   CO2 27 04/04/2013   Lab Results  Component Value Date   WBC 10.0 04/04/2013   HGB 10.1* 04/04/2013   HCT 30.1* 04/04/2013   MCV 97.4 04/04/2013   PLT 254 04/04/2013   Lab Results  Component Value Date   ALT 12 12/17/2012   AST 16 12/17/2012   ALKPHOS 91 12/17/2012   BILITOT 0.6 12/17/2012   Lab Results  Component Value Date   INR 1.36 03/28/2013   INR 1.70* 03/27/2013   INR 2.4 03/18/2013    Current radiology studies No results found.  Disposition:  01-Home or Self Care      Discharge Orders   Future  Appointments Provider Department Dept Phone   04/08/2013 10:30 AM [facility] [place] [facility] [place]   04/19/2013 11:00 AM Lorretta Harp, MD [facility] [phone]   04/23/2013 1:00 PM [place] [phone]   04/23/2013 2:15 PM Tanda Rockers, MD [facility] [phone]   Future Orders Complete By Expires   Call MD for:  difficulty breathing, headache or visual disturbances  As directed    Call MD for:  extreme fatigue  As directed    Call MD for:  severe uncontrolled pain  As directed    Call MD for:  temperature >100.4  As directed    Diet - low sodium heart healthy  As directed    Increase activity slowly  As directed        Medication List    STOP taking these medications       hydrALAZINE 25 MG tablet  Commonly known as:  APRESOLINE     warfarin 5 MG tablet  Commonly known as:  COUMADIN      TAKE these medications       bumetanide 2 MG tablet  Commonly known as:  BUMEX  Take 1 tablet (2 mg total) by mouth daily.     esomeprazole 40 MG capsule  Commonly known as:  NEXIUM  Take 40 mg by mouth daily before breakfast.     ipratropium-albuterol 0.5-2.5 (3) MG/3ML Soln  Commonly known as:  DUONEB  Take 3 mLs by nebulization every 4 (four) hours as needed (shortness of breath or wheeze).     metoprolol succinate 50 MG 24 hr tablet  Commonly known as:  TOPROL-XL  Take 50 mg by mouth 2 (two) times daily. Take with or immediately following a meal.     morphine 15 MG 12 hr tablet  Commonly known as:  MS CONTIN  Take 1 tablet (15 mg total) by mouth every 12 (twelve) hours.     oxyCODONE 5 MG immediate release tablet  Commonly known as:  Oxy IR/ROXICODONE  Take 1-2 tabs by mouth every four hours as needed for breakthrough pain     potassium chloride SA 20 MEQ tablet  Commonly known as:  K-DUR,KLOR-CON  Take 40 mEq by mouth daily.     sertraline 50 MG tablet  Commonly known as:   ZOLOFT  Take 1 tablet (50 mg total) by mouth daily.       Follow-up Information   Follow up with [facility] ([facility] to follow aftr d/c pls notify when pt ready to leave unit call [phone] (or if aftr 5 pnm [phone] ))    Contact information:   [place], [place]      Discharged Condition: fair  Physician Statement:   The Patient  was personally examined, the discharge assessment and plan has been personally reviewed and I agree with ACNP Hoffman's assessment and plan. > 30 minutes of time have been dedicated to discharge assessment, planning and discharge instructions.   Signed: Georgann Housekeeper, ACNP [facility] Pulmonology/Critical Care Pager [phone] or [phone]    PCCM ATTENDING: Agree with above. DC plan discussed with ACNP Hoffman. Home with Hospice  Merton Border, MD;  PCCM service; Mobile [phone]

## 2013-04-05 NOTE — Progress Notes (Signed)
Occupational Therapy Treatment Patient Details Name: Billy Casey D [place] MRN: [id number] DOB: Aug 11, 1940 Today's Date: 04/05/2013 Time: [id number] OT Time Calculation (min): 15 min  OT Assessment / Plan / Recommendation  History of present illness 73 yo male with hx COPD, CHF, AF on Coumadin presented 3/11 with small SDH after a fall.     OT comments  Pt progressing. Requires cues for deep breathing techniques. Anticipate possible return home today.  Follow Up Recommendations  Home health OT;Supervision/Assistance - 24 hour    Barriers to Discharge       Equipment Recommendations  None recommended by OT    Recommendations for Other Services    Frequency Min 2X/week   Progress towards OT Goals Progress towards OT goals: Progressing toward goals  Plan Discharge plan remains appropriate    Precautions / Restrictions Precautions Precautions: None Precaution Comments: watch O2 sats   Pertinent Vitals/Pain See vitals    ADL  Grooming: Performed;Wash/dry hands;Wash/dry face;Set up;Min guard Where Assessed - Grooming: Unsupported sitting Lower Body Dressing: Performed;Maximal assistance Where Assessed - Lower Body Dressing: Supported sit to stand Toilet Transfer: Simulated;Min Pension scheme managerguard Toilet Transfer Method: Sit to [place] Toilet Transfer Equipment:  (bed) Equipment Used: Gait belt;Rolling walker ADL Comments: Cues for deep breathing techniques    OT Diagnosis:    OT Problem List:   OT Treatment Interventions:     OT Goals(current goals can now be found in the care plan section) Acute Rehab OT Goals Patient Stated Goal: go home OT Goal Formulation: With patient Time For Goal Achievement: 04/10/13 Potential to Achieve Goals: Good ADL Goals Pt Will Perform Grooming: with supervision;with set-up;sitting;standing Pt Will Perform Upper Body Bathing: with set-up;with supervision;sitting Pt Will Perform Lower Body Bathing: with mod assist;with min assist;sitting/lateral leans;sit  to/from stand Pt Will Perform Upper Body Dressing: with set-up;with supervision;sitting Pt Will Transfer to Toilet: with min assist;with min guard assist;ambulating;regular height toilet;grab bars;bedside commode Pt Will Perform Toileting - Clothing Manipulation and hygiene: with mod assist;with min assist;sitting/lateral leans;sit to/from stand Pt Will Perform Tub/Shower Transfer: with min guard assist;with supervision;shower seat;grab bars Additional ADL Goal #1: Pt will verbalize/demo 3/3 energy conservation techniques during ADLs  Visit Information  Last OT Received On: 04/05/13 Assistance Needed: +1 History of Present Illness: 73 yo male with hx COPD, CHF, AF on Coumadin presented 3/11 with small SDH after a fall.      Subjective Data      Prior Functioning       Cognition  Cognition Arousal/Alertness: Awake/alert Behavior During Therapy: Flat affect Overall Cognitive Status: Impaired/Different from baseline Area of Impairment: Memory;Attention Current Attention Level: Sustained Memory: Decreased short-term memory    Mobility  Bed Mobility Overal bed mobility: Needs Assistance Bed Mobility: Supine to Sit;Sit to Supine Supine to sit: Min assist Sit to supine: Min guard General bed mobility comments: cueing and assist to pivot legs to EOB and elevate trunk Transfers Overall transfer level: Needs assistance Equipment used: Rolling walker (2 wheeled) Transfers: Sit to/from Stand Sit to Stand: Min guard    Exercises      Balance    End of Session OT - End of Session Equipment Utilized During Treatment: Rolling walker;Gait belt;Oxygen Activity Tolerance: Patient limited by fatigue Patient left: in bed;with call bell/phone within reach  GO   04/05/2013 Cipriano MileJohnson, Jenna Elizabeth OTR/L Pager [phone] Office [phone]   Cipriano MileJohnson, Jenna Elizabeth 04/05/2013, 3:07 PM

## 2013-04-05 NOTE — Progress Notes (Signed)
Pt discharged for home via ambulance transport.  02 on and Foley intact per orders.  Pt's daughter aware of plan to discharge with hospice and that [facility] will be delivering equipment.  No voiced complaints from pt.

## 2013-04-05 NOTE — Progress Notes (Signed)
Telemetry d/c'd per discharge home order. CCMD notified

## 2013-04-08 ENCOUNTER — Ambulatory Visit: Payer: Medicare Other

## 2013-04-08 NOTE — Telephone Encounter (Signed)
Pt is having increased pain, Billy Casey with Hospice would you like to know if you want Hospice to manage pt's pain or would you like to do so?  Please call Benna Dunksaryn and let her know. Thanks.

## 2013-04-08 NOTE — Telephone Encounter (Signed)
Per Oran ReinPadonda, it is ok for Hospice doctor to manage pt pain. Benna Dunksaryn is aware

## 2013-04-11 ENCOUNTER — Telehealth (HOSPITAL_COMMUNITY): Payer: Self-pay | Admitting: *Deleted

## 2013-04-19 ENCOUNTER — Ambulatory Visit: Payer: Medicare Other | Admitting: Cardiovascular Disease

## 2013-04-23 ENCOUNTER — Ambulatory Visit: Payer: Medicare Other | Admitting: Internal Medicine

## 2013-05-02 ENCOUNTER — Ambulatory Visit (INDEPENDENT_AMBULATORY_CARE_PROVIDER_SITE_OTHER): Payer: Medicare Other | Admitting: General Practice

## 2013-05-06 ENCOUNTER — Telehealth: Payer: Self-pay | Admitting: Family

## 2013-05-06 ENCOUNTER — Telehealth: Payer: Self-pay

## 2013-05-06 NOTE — Telephone Encounter (Signed)
Nurse states that pt has a cellulitis or some type of infection on and around his ear and pt's daughter would like it to be tx'd. Cordelia PenSherry states that medication has to be liquid form because pt cannot swallow pills.  Previous conversations and paperwork ask if PCP would like Hospice MD to care for pt and when I asked Cordelia PenSherry about this she states that they prefer that the nurses ask PCP if it is an issue outside of what pt is receiving Hospice care for. However, if PCP would like care deferred to Hospice MD, then she will contact that MD.   Per Oran ReinPadonda, defer care to Hospice MD due to the fact that she cannot see the rash and would rather not tx a rash that could be fungal with an abx without seeing. Cordelia PenSherry is aware and will contact Hospice MD

## 2013-05-06 NOTE — Telephone Encounter (Signed)
error 

## 2013-06-10 ENCOUNTER — Encounter (HOSPITAL_COMMUNITY): Payer: Self-pay | Admitting: Emergency Medicine

## 2013-06-10 ENCOUNTER — Inpatient Hospital Stay (HOSPITAL_COMMUNITY)
Admission: EM | Admit: 2013-06-10 | Discharge: 2013-06-14 | DRG: 871 | Disposition: A | Discharge status: Hospice | Attending: Internal Medicine | Admitting: Internal Medicine

## 2013-06-10 DIAGNOSIS — A419 Sepsis, unspecified organism: Principal | ICD-10-CM | POA: Diagnosis present

## 2013-06-10 DIAGNOSIS — I4891 Unspecified atrial fibrillation: Secondary | ICD-10-CM | POA: Diagnosis present

## 2013-06-10 DIAGNOSIS — I10 Essential (primary) hypertension: Secondary | ICD-10-CM | POA: Diagnosis present

## 2013-06-10 DIAGNOSIS — Z8249 Family history of ischemic heart disease and other diseases of the circulatory system: Secondary | ICD-10-CM

## 2013-06-10 DIAGNOSIS — N501 Vascular disorders of male genital organs: Secondary | ICD-10-CM | POA: Diagnosis present

## 2013-06-10 DIAGNOSIS — J8410 Pulmonary fibrosis, unspecified: Secondary | ICD-10-CM | POA: Diagnosis present

## 2013-06-10 DIAGNOSIS — R1310 Dysphagia, unspecified: Secondary | ICD-10-CM | POA: Diagnosis present

## 2013-06-10 DIAGNOSIS — R369 Urethral discharge, unspecified: Secondary | ICD-10-CM | POA: Diagnosis present

## 2013-06-10 DIAGNOSIS — IMO0002 Reserved for concepts with insufficient information to code with codable children: Secondary | ICD-10-CM

## 2013-06-10 DIAGNOSIS — E876 Hypokalemia: Secondary | ICD-10-CM

## 2013-06-10 DIAGNOSIS — N433 Hydrocele, unspecified: Secondary | ICD-10-CM

## 2013-06-10 DIAGNOSIS — J9601 Acute respiratory failure with hypoxia: Secondary | ICD-10-CM

## 2013-06-10 DIAGNOSIS — I5032 Chronic diastolic (congestive) heart failure: Secondary | ICD-10-CM

## 2013-06-10 DIAGNOSIS — J4489 Other specified chronic obstructive pulmonary disease: Secondary | ICD-10-CM | POA: Diagnosis present

## 2013-06-10 DIAGNOSIS — N390 Urinary tract infection, site not specified: Secondary | ICD-10-CM

## 2013-06-10 DIAGNOSIS — J449 Chronic obstructive pulmonary disease, unspecified: Secondary | ICD-10-CM

## 2013-06-10 DIAGNOSIS — Z950 Presence of cardiac pacemaker: Secondary | ICD-10-CM

## 2013-06-10 DIAGNOSIS — S3730XA Unspecified injury of urethra, initial encounter: Secondary | ICD-10-CM

## 2013-06-10 DIAGNOSIS — N508 Other specified disorders of male genital organs: Secondary | ICD-10-CM | POA: Diagnosis present

## 2013-06-10 DIAGNOSIS — Z515 Encounter for palliative care: Secondary | ICD-10-CM

## 2013-06-10 DIAGNOSIS — R627 Adult failure to thrive: Secondary | ICD-10-CM | POA: Diagnosis present

## 2013-06-10 DIAGNOSIS — R0600 Dyspnea, unspecified: Secondary | ICD-10-CM

## 2013-06-10 DIAGNOSIS — J849 Interstitial pulmonary disease, unspecified: Secondary | ICD-10-CM

## 2013-06-10 DIAGNOSIS — E43 Unspecified severe protein-calorie malnutrition: Secondary | ICD-10-CM | POA: Insufficient documentation

## 2013-06-10 DIAGNOSIS — D649 Anemia, unspecified: Secondary | ICD-10-CM | POA: Diagnosis present

## 2013-06-10 DIAGNOSIS — Z79899 Other long term (current) drug therapy: Secondary | ICD-10-CM

## 2013-06-10 DIAGNOSIS — N12 Tubulo-interstitial nephritis, not specified as acute or chronic: Secondary | ICD-10-CM

## 2013-06-10 DIAGNOSIS — R6520 Severe sepsis without septic shock: Secondary | ICD-10-CM

## 2013-06-10 DIAGNOSIS — Z66 Do not resuscitate: Secondary | ICD-10-CM | POA: Diagnosis present

## 2013-06-10 DIAGNOSIS — Z803 Family history of malignant neoplasm of breast: Secondary | ICD-10-CM

## 2013-06-10 DIAGNOSIS — R339 Retention of urine, unspecified: Secondary | ICD-10-CM | POA: Diagnosis present

## 2013-06-10 DIAGNOSIS — J9610 Chronic respiratory failure, unspecified whether with hypoxia or hypercapnia: Secondary | ICD-10-CM

## 2013-06-10 DIAGNOSIS — Z87891 Personal history of nicotine dependence: Secondary | ICD-10-CM

## 2013-06-10 DIAGNOSIS — Z9089 Acquired absence of other organs: Secondary | ICD-10-CM

## 2013-06-10 DIAGNOSIS — N5089 Other specified disorders of the male genital organs: Secondary | ICD-10-CM | POA: Diagnosis present

## 2013-06-10 DIAGNOSIS — Q549 Hypospadias, unspecified: Secondary | ICD-10-CM

## 2013-06-10 DIAGNOSIS — Z823 Family history of stroke: Secondary | ICD-10-CM

## 2013-06-10 DIAGNOSIS — I509 Heart failure, unspecified: Secondary | ICD-10-CM | POA: Diagnosis present

## 2013-06-10 LAB — COMPREHENSIVE METABOLIC PANEL
ALK PHOS: 96 U/L (ref 39–117)
ALT: 8 U/L (ref 0–53)
AST: 12 U/L (ref 0–37)
Albumin: 2.9 g/dL — ABNORMAL LOW (ref 3.5–5.2)
BUN: 29 mg/dL — ABNORMAL HIGH (ref 6–23)
CALCIUM: 9.4 mg/dL (ref 8.4–10.5)
CO2: 28 mEq/L (ref 19–32)
Chloride: 95 mEq/L — ABNORMAL LOW (ref 96–112)
Creatinine, Ser: 0.5 mg/dL (ref 0.50–1.35)
GFR calc non Af Amer: >90 mL/min (ref >90)
GLUCOSE: 166 mg/dL — AB (ref 70–99)
POTASSIUM: 3.2 meq/L — AB (ref 3.7–5.3)
Sodium: 142 mEq/L (ref 137–147)
Total Bilirubin: 2.5 mg/dL — ABNORMAL HIGH (ref 0.3–1.2)
Total Protein: 7.3 g/dL (ref 6.0–8.3)

## 2013-06-10 LAB — CBC WITH DIFFERENTIAL/PLATELET
BASOS ABS: 0 10*3/uL (ref 0.0–0.1)
Basophils Relative: 0 % (ref 0–1)
EOS ABS: 0 10*3/uL (ref 0.0–0.7)
Eosinophils Relative: 0 % (ref 0–5)
HCT: 39 % (ref 39.0–52.0)
HEMOGLOBIN: 13.1 g/dL (ref 13.0–17.0)
Lymphocytes Relative: 5 % — ABNORMAL LOW (ref 12–46)
Lymphs Abs: 1.3 10*3/uL (ref 0.7–4.0)
MCH: 32.7 pg (ref 26.0–34.0)
MCHC: 33.6 g/dL (ref 30.0–36.0)
MCV: 97.3 fL (ref 78.0–100.0)
MONO ABS: 1.3 10*3/uL — AB (ref 0.1–1.0)
Monocytes Relative: 5 % (ref 3–12)
NEUTROS ABS: 24.2 10*3/uL — AB (ref 1.7–7.7)
Neutrophils Relative %: 90 % — ABNORMAL HIGH (ref 43–77)
Platelets: 283 10*3/uL (ref 150–400)
RBC: 4.01 MIL/uL — ABNORMAL LOW (ref 4.22–5.81)
RDW: 15.4 % (ref 11.5–15.5)
WBC Morphology: INCREASED
WBC: 26.8 10*3/uL — ABNORMAL HIGH (ref 4.0–10.5)

## 2013-06-10 LAB — URINALYSIS, ROUTINE W REFLEX MICROSCOPIC
GLUCOSE, UA: NEGATIVE mg/dL
Ketones, ur: 15 mg/dL — AB
NITRITE: POSITIVE — AB
PH: 7.5 (ref 5.0–8.0)
Protein, ur: 30 mg/dL — AB
SPECIFIC GRAVITY, URINE: 1.016 (ref 1.005–1.030)
Urobilinogen, UA: 4 mg/dL — ABNORMAL HIGH (ref 0.0–1.0)

## 2013-06-10 LAB — URINE MICROSCOPIC-ADD ON

## 2013-06-10 NOTE — ED Notes (Signed)
Pt presents by EMS with c/o penile bleeding, blood in his urine, catheter issues, and an enlarged testicle. Pt is under the care of Hospice and has a CNA that takes care of him at home who is also a family member. Pt has a strong odor of urine about him, the catheter is grossly soiled and draining dark, foul, matter. Per EMS, pt's family also reports enlarged testicles. Pt wears O2 at home at 4L all of the time. Per EMS, pt will moan with painful stimuli but this is normal per family.

## 2013-06-10 NOTE — ED Notes (Signed)
Pt is on cardiac monitoring at this time.

## 2013-06-10 NOTE — ED Notes (Signed)
Pt was given a bed bath by Wardell Heath, RN, Sharon Seller, RN, Clydie Braun EMT, and [place], EMT upon arrival to ED. Pt's sheets from home were soiled and Pt's peri area and bottom needed to be cleaned as well. Soap, water, peri-care spray, and lotion were used to clean the Pt.

## 2013-06-10 NOTE — ED Notes (Addendum)
When patient arrived in the ER, he was laying on soiled, old linen and bed pads that were so thoroughly soaked they were disintegrating. Pt had a foley catheter in place that presented with a very foul odor and the catheter tubing was lined with dark brown residue. The entire foley bag was filled with brown urine and foul material. Verbal order received from Dr. Silverio Lay to discontinue the foley catheter and place a new one. Tip of catheter was completely clogged with pus and as the catheter was removed, pt began to urinate blood-tinged urine. New catheter placed with no difficulty. Pt has a penile injury from ripping a previous catheter out. Tip of penis is red and swollen and the right testicle is approx the size of a grapefruit. Daughter brought to bedside and she explained that patient has a Hospice nurse that checks in on the patient and that the family has addressed these issues with the nurse but it has been overlooked. Family says that they are taking care of the patient to the best of their ability at this time with help from Hospice. This RN contacted [facility] and filed a report, spoke extensively with someone about this situation, and they will be following up.

## 2013-06-10 NOTE — ED Notes (Signed)
Bed: CL27 Expected date: 06/10/13 Expected time: 9:27 PM Means of arrival: Ambulance Comments: 73 yo M  Urinary problems

## 2013-06-11 ENCOUNTER — Emergency Department (HOSPITAL_COMMUNITY)

## 2013-06-11 ENCOUNTER — Encounter (HOSPITAL_COMMUNITY): Payer: Self-pay

## 2013-06-11 ENCOUNTER — Inpatient Hospital Stay (HOSPITAL_COMMUNITY)

## 2013-06-11 DIAGNOSIS — R6520 Severe sepsis without septic shock: Secondary | ICD-10-CM

## 2013-06-11 DIAGNOSIS — A419 Sepsis, unspecified organism: Secondary | ICD-10-CM | POA: Diagnosis present

## 2013-06-11 DIAGNOSIS — N390 Urinary tract infection, site not specified: Secondary | ICD-10-CM

## 2013-06-11 LAB — I-STAT CG4 LACTIC ACID, ED: Lactic Acid, Venous: 3.41 mmol/L — ABNORMAL HIGH (ref 0.5–2.2)

## 2013-06-11 LAB — MRSA PCR SCREENING: MRSA by PCR: POSITIVE — AB

## 2013-06-11 MED ORDER — SODIUM CHLORIDE 0.9 % IV SOLN: 3.0000 g | Freq: Once | INTRAVENOUS | Status: DC

## 2013-06-11 MED ORDER — BIOTENE DRY MOUTH MT LIQD
15.0000 mL | Freq: Two times a day (BID) | OROMUCOSAL | Status: DC
  Administered 2013-06-11 – 2013-06-14 (×6): 15 mL via OROMUCOSAL

## 2013-06-11 MED ORDER — SODIUM CHLORIDE 0.9 % IV SOLN: Freq: Once | INTRAVENOUS | Status: DC

## 2013-06-11 MED ORDER — CHLORHEXIDINE GLUCONATE CLOTH 2 % EX PADS
6.0000 | MEDICATED_PAD | Freq: Every day | CUTANEOUS | Status: DC
  Administered 2013-06-12 – 2013-06-13 (×2): 6 via TOPICAL

## 2013-06-11 MED ORDER — SODIUM CHLORIDE 0.9 % IV BOLUS (SEPSIS)
1000.0000 mL | Freq: Once | INTRAVENOUS | Status: AC
  Administered 2013-06-11: 1000 mL via INTRAVENOUS

## 2013-06-11 MED ORDER — VANCOMYCIN HCL IN DEXTROSE 1-5 GM/200ML-% IV SOLN
1000.0000 mg | Freq: Once | INTRAVENOUS | Status: AC
  Administered 2013-06-11: 1000 mg via INTRAVENOUS
  Filled 2013-06-11: qty 200

## 2013-06-11 MED ORDER — HALOPERIDOL 2 MG PO TABS
4.0000 mg | ORAL_TABLET | ORAL | Status: DC | PRN
  Filled 2013-06-11: qty 2

## 2013-06-11 MED ORDER — SODIUM CHLORIDE 0.9 % IJ SOLN
3.0000 mL | Freq: Two times a day (BID) | INTRAMUSCULAR | Status: DC
  Administered 2013-06-12 – 2013-06-13 (×2): 3 mL via INTRAVENOUS

## 2013-06-11 MED ORDER — DEXTROSE 5 % IV SOLN
1.0000 g | INTRAVENOUS | Status: DC
  Administered 2013-06-12 – 2013-06-14 (×3): 1 g via INTRAVENOUS
  Filled 2013-06-11 (×4): qty 10

## 2013-06-11 MED ORDER — SODIUM CHLORIDE 0.9 % IV BOLUS (SEPSIS): 1000.0000 mL | Freq: Once | INTRAVENOUS | Status: DC

## 2013-06-11 MED ORDER — CLINDAMYCIN PHOSPHATE 900 MG/50ML IV SOLN
900.0000 mg | Freq: Once | INTRAVENOUS | Status: AC
  Administered 2013-06-11: 900 mg via INTRAVENOUS
  Filled 2013-06-11: qty 50

## 2013-06-11 MED ORDER — SODIUM CHLORIDE 0.9 % IV SOLN
INTRAVENOUS | Status: DC
  Administered 2013-06-11 (×2): via INTRAVENOUS

## 2013-06-11 MED ORDER — IOHEXOL 300 MG/ML  SOLN: 100.0000 mL | Freq: Once | INTRAMUSCULAR | Status: DC | PRN

## 2013-06-11 MED ORDER — MORPHINE SULFATE (CONCENTRATE) 10 MG /0.5 ML PO SOLN
10.0000 mg | ORAL | Status: DC | PRN
  Administered 2013-06-11 – 2013-06-14 (×7): 10 mg via ORAL
  Filled 2013-06-11 (×7): qty 0.5

## 2013-06-11 MED ORDER — POTASSIUM CHLORIDE 10 MEQ/100ML IV SOLN
10.0000 meq | INTRAVENOUS | Status: AC
  Administered 2013-06-12 (×4): 10 meq via INTRAVENOUS
  Filled 2013-06-11 (×4): qty 100

## 2013-06-11 MED ORDER — MUPIROCIN 2 % EX OINT
1.0000 "application " | TOPICAL_OINTMENT | Freq: Two times a day (BID) | CUTANEOUS | Status: DC
  Administered 2013-06-11 – 2013-06-13 (×6): 1 via NASAL
  Filled 2013-06-11 (×2): qty 22

## 2013-06-11 MED ORDER — CHLORHEXIDINE GLUCONATE 0.12 % MT SOLN
15.0000 mL | Freq: Two times a day (BID) | OROMUCOSAL | Status: DC
  Administered 2013-06-11 – 2013-06-13 (×4): 15 mL via OROMUCOSAL
  Filled 2013-06-11 (×9): qty 15

## 2013-06-11 MED ORDER — LORAZEPAM 1 MG PO TABS
1.0000 mg | ORAL_TABLET | ORAL | Status: DC | PRN
  Administered 2013-06-12 – 2013-06-14 (×3): 1 mg via ORAL
  Filled 2013-06-11 (×3): qty 1

## 2013-06-11 MED ORDER — DEXTROSE 5 % IV SOLN
1.0000 g | Freq: Once | INTRAVENOUS | Status: AC
  Administered 2013-06-11: 1 g via INTRAVENOUS
  Filled 2013-06-11: qty 10

## 2013-06-11 NOTE — ED Provider Notes (Signed)
Shared service with midlevel provider. I have personally seen and examined the patient, providing direct face to face care, presenting with the chief complaint of penile bleeding and scrotal swelling. Pt has end stage lung dz, on Hospice. Traumatic incident took back few days ago. Physical exam findings include edematous and enlarged right scrotum with an indurated firm circular object palpable. Abd exam, though limited due to altered mentation, however there is no rigidity. UA is showing infection. US shows complex fluid collection in the right hemiscrotum. WC is elevated. Lactate is elevated. Bili is elevated. Pt is in severe sepsis. Urology consulted, and i spoke with Dr. Brunilda Payor  -who will see the patient later this morning. I spoke with Dr. Brunilda Payor about the radiologic findings suspicion for possible abscess in the right scrotum and that patient is in severe sepsis - to see if he would come in and see the patient emergently and drain the possible abscess. Dr. Brunilda Payor doesn't think that the complex fluid is abscess, and he will assess patient later today as he intended. Plan will be medicine admit. I have reviewed the nursing documentation on past medical history, family history, and social history.  CODE STATUS - DNR/DNI, HOSPICE. OK with antibiotics.  CRITICAL CARE Performed by: Jakarri Lesko   Total critical care time: 90 minutes  Critical care time was exclusive of separately billable procedures and treating other patients.  Critical care was necessary to treat or prevent imminent or life-threatening deterioration.  Critical care was time spent personally by me on the following activities: development of treatment plan with patient and/or surrogate as well as nursing, discussions with consultants, evaluation of patient's response to treatment, examination of patient, obtaining history from patient or surrogate, ordering and performing treatments and interventions, ordering and review of laboratory  studies, ordering and review of radiographic studies, pulse oximetry and re-evaluation of patient's condition.    Derwood Kaplan, MD 06/11/13 [phone]

## 2013-06-11 NOTE — ED Notes (Signed)
US at bedside

## 2013-06-11 NOTE — Progress Notes (Signed)
Admitted this morning, 5/26, see  H&P by Dr Lyda Perone for details 73 year old male with history of COPD, CHF, A. fib, interstitial lung disease who is currently enrolled in hospice, was brought to the hospital after patient had thick brownish discharge around the Foley. Patient found to have UTI, evaluated by urology in the ED. Patient found to be septic due to UTI and admitted to step down unit for IV fluids and IV antibiotics. At this time patient just moans to verbal stimuli.    Filed Vitals:   06/11/13 1300  BP:   Pulse: 101  Temp:   Resp: 26    Chest: Clear Bilaterally Heart : S1S2 RRR Abdomen: Soft, nontender Ext : No edema Neuro: Somnolent, moans to verbal stimuli  A/P Sepsis due to UTI  Called and discussed with patient's daughter Jackie Plum, who says that patient has been on hospice, not very functional has been mainly bedbound. Patient has limited by mouth intake, also has limited communication due to somnolence from the pain medications. She understands that patient is at terminal stage, and may not survive the hospitalization. If patient improves in next 24-48 hours, she would like to take the patient home with hospice. Will continue IV Rocephin, await the urine culture results.    Meredeth Ide [facility] [phone]

## 2013-06-11 NOTE — Progress Notes (Signed)
Agree with assessment from this morning from ICU RN. Patient is resting comfortably in bed. Mittens on to prevent pulling at catheter. Will continue to monitor.

## 2013-06-11 NOTE — ED Provider Notes (Signed)
CSN: [id number]     Arrival date & time 06/10/13  2158 History   First MD Initiated Contact with Patient 06/10/13 2240     Chief Complaint  Patient presents with  . Penile bleeding    . Catheter issues   . Urinary Retention     (Consider location/radiation/quality/duration/timing/severity/associated sxs/prior Treatment) HPI  Patient presents to the emergency department with swollen testicles, blood in his urine and purulence coming from the head of his penis.  The daughter states, that the patient had an injury to his urethral opening while pulling out his catheter.  Patient is not able to give me any history.  Daughter states, that the hospice nurse is aware of this injured area, but did not seem concerned daughter, states, that she's unsure if patient has had any fever.  Patient has not had any vomiting, diarrhea, cough Past Medical History  Diagnosis Date  . COPD (chronic obstructive pulmonary disease)   . CHF (congestive heart failure)   . Hypertension   . Atrial fibrillation   . Interstitial lung disease   . Pacemaker   . Chest pain   . Dyspnea on exertion    Past Surgical History  Procedure Laterality Date  . Cholecystectomy    . Permanent pacemaker insertion     Family History  Problem Relation Age of Onset  . Stroke Mother   . CAD Father   . Breast cancer Mother    History  Substance Use Topics  . Smoking status: Former Smoker -- 0.50 packs/day for 3 years    Types: Cigarettes    Quit date: 01/17/1977  . Smokeless tobacco: Current User    Types: Chew  . Alcohol Use: Yes    Review of Systems Level V caveat applies due to chronic unresponsiveness Allergies  Penicillins  Home Medications   Prior to Admission medications   Medication Sig Start Date End Date Taking? Authorizing Provider  haloperidol (HALDOL) 2 MG tablet Take 4 mg by mouth every 4 (four) hours as needed for agitation (agitation).   Yes Historical Provider, MD  LORazepam (ATIVAN) 1 MG tablet  Take 1 mg by mouth every 4 (four) hours as needed for anxiety (agitation).   Yes Historical Provider, MD  morphine (ROXANOL) 20 MG/ML concentrated solution Take 10 mg by mouth every 2 (two) hours as needed for severe pain (SOB/ Pain).   Yes Historical Provider, MD   BP 116/71  Pulse 99  Temp(Src) 99.5 F (37.5 C) (Rectal)  Resp 27  SpO2 99% Physical Exam  Nursing note and vitals reviewed. Constitutional: He appears well-developed and well-nourished. No distress.  HENT:  Head: Normocephalic and atraumatic.  Mouth/Throat: Oropharynx is clear and moist.  Eyes: Pupils are equal, round, and reactive to light.  Neck: Normal range of motion. Neck supple.  Cardiovascular: Normal rate and normal heart sounds.  Exam reveals no gallop and no friction rub.   No murmur heard. Pulmonary/Chest: Effort normal and breath sounds normal. No respiratory distress.  Abdominal: Soft. Bowel sounds are normal. He exhibits no distension. There is no guarding.  Genitourinary:    Right testis shows swelling. Left testis shows swelling.  Skin: Skin is warm and dry.    ED Course  Procedures (including critical care time) Labs Review Labs Reviewed  CBC WITH DIFFERENTIAL - Abnormal; Notable for the following:    WBC 26.8 (*)    RBC 4.01 (*)    Neutrophils Relative % 90 (*)    Lymphocytes Relative 5 (*)  Neutro Abs 24.2 (*)    Monocytes Absolute 1.3 (*)    All other components within normal limits  COMPREHENSIVE METABOLIC PANEL - Abnormal; Notable for the following:    Potassium 3.2 (*)    Chloride 95 (*)    Glucose, Bld 166 (*)    BUN 29 (*)    Albumin 2.9 (*)    Total Bilirubin 2.5 (*)    All other components within normal limits  URINALYSIS, ROUTINE W REFLEX MICROSCOPIC - Abnormal; Notable for the following:    Color, Urine ORANGE (*)    APPearance TURBID (*)    Hgb urine dipstick MODERATE (*)    Bilirubin Urine MODERATE (*)    Ketones, ur 15 (*)    Protein, ur 30 (*)    Urobilinogen, UA  4.0 (*)    Nitrite POSITIVE (*)    Leukocytes, UA LARGE (*)    All other components within normal limits  URINE MICROSCOPIC-ADD ON - Abnormal; Notable for the following:    Bacteria, UA MANY (*)    All other components within normal limits  URINE CULTURE   patient will be admitted to the hospital and treated for his UTI     Carlyle Dollyhristopher W Micco Bourbeau, PA-C 06/12/13 0139

## 2013-06-11 NOTE — Consult Note (Signed)
Urology Consult  Referring physician: Varney Biles Reason for referral: Ankit Nanavati  Chief Complaint: Swelling of the scrotum, urethral discharge, hematuria  History of Present Illness: Patient is a 73 years old male who is a hospice patient. He was brought to the emergency room with complaints of penile bleeding, pus around the Foley catheter and a blood in the urine. I could not get any history from the patient. Information was obtained form reviewing the record. Apparently pulled a Foley catheter out the while back and sustained a urethral injury. He was found able to the emergency room because of concern Re: penile bleeding blood in the urine and pus around the Foley and in the urine. He was also noted to have a swelling of the right scrotum. Scrotal ultrasound revealed a complex fluid collection in the right scrotum with diffuse internal echogenicity. I was then asked to see him in consultation. On examination he has a right hydrocele. There is no evidence of scrotal abscess. He has a iatrogenic hypospadias with some purulent drainage around the Foley. He has an inflatable penile prosthesis with the inflate deflate pump in the left scrotum.  Past Medical History  Diagnosis Date  . COPD (chronic obstructive pulmonary disease)   . CHF (congestive heart failure)   . Hypertension   . Atrial fibrillation   . Interstitial lung disease   . Pacemaker   . Chest pain   . Dyspnea on exertion    Past Surgical History  Procedure Laterality Date  . Cholecystectomy    . Permanent pacemaker insertion      Medications: Haldol, lorazepam, morphine Allergies:  Allergies  Allergen Reactions  . Penicillins Other (See Comments)    Reaction unknown    Family History  Problem Relation Age of Onset  . Stroke Mother   . CAD Father   . Breast cancer Mother    Social History:  reports that he quit smoking about 36 years ago. His smoking use included Cigarettes. He has a 1.5 pack-year smoking  history. His smokeless tobacco use includes Chew. He reports that he drinks alcohol. He reports that he does not use illicit drugs.  ROS: Could not obtain any information from the patient   Physical Exam:  Vital signs in last 24 hours: Temp:  [97.5 F (36.4 C)-100.5 F (38.1 C)] 99.2 F (37.3 C) (05/26 0800) Pulse Rate:  [78-107] 96 (05/26 0800) Resp:  [16-36] 34 (05/26 0800) BP: (109-140)/(54-79) 120/54 mmHg (05/26 0700) SpO2:  [94 %-100 %] 97 % (05/26 0800) Weight:  [82.5 kg (181 lb 14.1 oz)] 82.5 kg (181 lb 14.1 oz) (05/26 0617)  Cardiovascular: Skin warm; not flushed Respiratory: Breaths quiet; no shortness of breath Abdomen: No masses Neurological: Normal sensation to touch Musculoskeletal: Normal motor function arms and legs Lymphatics: No inguinal adenopathy Skin: No rashes Genitourinary: Iatrogenic hypospadias with the ureteral meatus at the mid shaft. There is a penile prosthesis in place with the pump in the left scrotum. The right scrotum is swollen. It is firm. There is no skin edema. There is no evidence of scrotal abscess. The left testicle is normal. There is an inflate deflate pump in the left scrotum. Rectal examination: Deferred  Laboratory Data:  Results for orders placed during the hospital encounter of 06/10/13 (from the past 72 hour(s))  CBC WITH DIFFERENTIAL     Status: Abnormal   Collection Time    06/10/13 10:27 PM      Result Value Ref Range   WBC 26.8 (*) 4.0 -  10.5 K/uL   RBC 4.01 (*) 4.22 - 5.81 MIL/uL   Hemoglobin 13.1  13.0 - 17.0 g/dL   HCT 39.0  39.0 - 52.0 %   MCV 97.3  78.0 - 100.0 fL   MCH 32.7  26.0 - 34.0 pg   MCHC 33.6  30.0 - 36.0 g/dL   RDW 15.4  11.5 - 15.5 %   Platelets 283  150 - 400 K/uL   Neutrophils Relative % 90 (*) 43 - 77 %   Lymphocytes Relative 5 (*) 12 - 46 %   Monocytes Relative 5  3 - 12 %   Eosinophils Relative 0  0 - 5 %   Basophils Relative 0  0 - 1 %   Neutro Abs 24.2 (*) 1.7 - 7.7 K/uL   Lymphs Abs 1.3  0.7 -  4.0 K/uL   Monocytes Absolute 1.3 (*) 0.1 - 1.0 K/uL   Eosinophils Absolute 0.0  0.0 - 0.7 K/uL   Basophils Absolute 0.0  0.0 - 0.1 K/uL   WBC Morphology INCREASED BANDS (>20% BANDS)    COMPREHENSIVE METABOLIC PANEL     Status: Abnormal   Collection Time    06/10/13 10:27 PM      Result Value Ref Range   Sodium 142  137 - 147 mEq/L   Potassium 3.2 (*) 3.7 - 5.3 mEq/L   Chloride 95 (*) 96 - 112 mEq/L   CO2 28  19 - 32 mEq/L   Glucose, Bld 166 (*) 70 - 99 mg/dL   BUN 29 (*) 6 - 23 mg/dL   Creatinine, Ser 0.50  0.50 - 1.35 mg/dL   Calcium 9.4  8.4 - 10.5 mg/dL   Total Protein 7.3  6.0 - 8.3 g/dL   Albumin 2.9 (*) 3.5 - 5.2 g/dL   AST 12  0 - 37 U/L   ALT 8  0 - 53 U/L   Alkaline Phosphatase 96  39 - 117 U/L   Total Bilirubin 2.5 (*) 0.3 - 1.2 mg/dL   GFR calc non Af Amer >90  >90 mL/min   GFR calc Af Amer >90  >90 mL/min   Comment: (NOTE)     The eGFR has been calculated using the CKD EPI equation.     This calculation has not been validated in all clinical situations.     eGFR's persistently <90 mL/min signify possible Chronic Kidney     Disease.  URINALYSIS, ROUTINE W REFLEX MICROSCOPIC     Status: Abnormal   Collection Time    06/10/13 10:47 PM      Result Value Ref Range   Color, Urine ORANGE (*) YELLOW   Comment: BIOCHEMICALS MAY BE AFFECTED BY COLOR   APPearance TURBID (*) CLEAR   Specific Gravity, Urine 1.016  1.005 - 1.030   pH 7.5  5.0 - 8.0   Glucose, UA NEGATIVE  NEGATIVE mg/dL   Hgb urine dipstick MODERATE (*) NEGATIVE   Bilirubin Urine MODERATE (*) NEGATIVE   Ketones, ur 15 (*) NEGATIVE mg/dL   Protein, ur 30 (*) NEGATIVE mg/dL   Urobilinogen, UA 4.0 (*) 0.0 - 1.0 mg/dL   Nitrite POSITIVE (*) NEGATIVE   Leukocytes, UA LARGE (*) NEGATIVE  URINE MICROSCOPIC-ADD ON     Status: Abnormal   Collection Time    06/10/13 10:47 PM      Result Value Ref Range   WBC, UA TOO NUMEROUS TO COUNT  <3 WBC/hpf   RBC / HPF 11-20  <3 RBC/hpf  Bacteria, UA MANY (*) RARE   I-STAT CG4 LACTIC ACID, ED     Status: Abnormal   Collection Time    06/11/13  1:33 AM      Result Value Ref Range   Lactic Acid, Venous 3.41 (*) 0.5 - 2.2 mmol/L  MRSA PCR SCREENING     Status: Abnormal   Collection Time    06/11/13  6:17 AM      Result Value Ref Range   MRSA by PCR POSITIVE (*) NEGATIVE   Comment: RESULT CALLED TO, READ BACK BY AND VERIFIED WITH:     L. STERN RN AT 3009 ON 05.26.15 BY SHUEA                The GeneXpert MRSA Assay (FDA     approved for NASAL specimens     only), is one component of a     comprehensive MRSA colonization     surveillance program. It is not     intended to diagnose MRSA     infection nor to guide or     monitor treatment for     MRSA infections.   Recent Results (from the past 240 hour(s))  MRSA PCR SCREENING     Status: Abnormal   Collection Time    06/11/13  6:17 AM      Result Value Ref Range Status   MRSA by PCR POSITIVE (*) NEGATIVE Final   Comment: RESULT CALLED TO, READ BACK BY AND VERIFIED WITH:     L. STERN RN AT 7949 ON 05.26.15 BY SHUEA                The GeneXpert MRSA Assay (FDA     approved for NASAL specimens     only), is one component of a     comprehensive MRSA colonization     surveillance program. It is not     intended to diagnose MRSA     infection nor to guide or     monitor treatment for     MRSA infections.   Creatinine:  Recent Labs  06/10/13 2227  CREATININE 0.50    Xrays: See report/chart   Impression/Assessment:   Right hydrocele.  Iatrogenic hypospadias.  S/P penile prosthesis.  UTI. H/O COPD and CHF  Plan:  Conservative management.  Urine culture and sensitivity. He does not need any urologic intervention at this time.  The hydrocele has probably been present for a while. He is going to have urinary tract infection as long as he has an indwelling Foley catheter. Would treat with antibiotics only for symptomatic UTI. Need to watch for erosion of penile prosthesis since he has  had a long term indwelling Foley catheter.  Charlene Brooke Mikayela Deats 06/11/2013, 9:36 AM    CC: Dr Varney Biles

## 2013-06-11 NOTE — H&P (Signed)
[facility] History and Physical  Billy Casey ZOX:[id number] DOB: November 02, 1940 DOA: 06/10/2013  Referring physician: EDP PCP: Janell Quiet, FNP   Chief Complaint: Blood in urine   HPI: Billy Casey is a 73 y.o. male in hospice at baseline, brought in by family today with c/o penile bleeding, blood in urine, pus in catheter and urine as well as around the foley catheter, enlargement of his testicle.  Unfortunatly this has been going on and worsening for a number of days to weeks per family, patient had injury to uretheral opening while pulling out his catheter per daughter.  Patient is unable to give me any history at this time.  Per daughter, the hospice nurse is aware of the injured area and numerous urinary changes as described above, but the nurse did not seem concerned.  Daughter is unsure if the patient has had any fever   Review of Systems: Unable to perform due to patient mental status.  Past Medical History  Diagnosis Date  . COPD (chronic obstructive pulmonary disease)   . CHF (congestive heart failure)   . Hypertension   . Atrial fibrillation   . Interstitial lung disease   . Pacemaker   . Chest pain   . Dyspnea on exertion    Past Surgical History  Procedure Laterality Date  . Cholecystectomy    . Permanent pacemaker insertion     Social History:  reports that he quit smoking about 36 years ago. His smoking use included Cigarettes. He has a 1.5 pack-year smoking history. His smokeless tobacco use includes Chew. He reports that he drinks alcohol. He reports that he does not use illicit drugs.  Allergies  Allergen Reactions  . Penicillins Other (See Comments)    Reaction unknown    Family History  Problem Relation Age of Onset  . Stroke Mother   . CAD Father   . Breast cancer Mother      Prior to Admission medications   Medication Sig Start Date End Date Taking? Authorizing Provider  haloperidol (HALDOL) 2 MG tablet Take 4 mg by mouth  every 4 (four) hours as needed for agitation (agitation).   Yes Historical Provider, MD  LORazepam (ATIVAN) 1 MG tablet Take 1 mg by mouth every 4 (four) hours as needed for anxiety (agitation).   Yes Historical Provider, MD  morphine (ROXANOL) 20 MG/ML concentrated solution Take 10 mg by mouth every 2 (two) hours as needed for severe pain (SOB/ Pain).   Yes Historical Provider, MD   Physical Exam: Filed Vitals:   06/11/13 0500  BP: 132/58  Pulse: 96  Temp:   Resp: 26    BP 132/58  Pulse 96  Temp(Src) 97.5 F (36.4 C) (Oral)  Resp 26  SpO2 97%  General Appearance:    Lethargic, toxic appearing, appears also to have abnormal kussmal like respirations, patient not intubated due to DNI status.  Head:    Normocephalic, atraumatic  Eyes:    PERRL, EOMI, sclera non-icteric        Nose:   Nares without drainage or epistaxis. Mucosa, turbinates normal  Throat:   Moist mucous membranes. Oropharynx without erythema or exudate.  Neck:   Supple. No carotid bruits.  No thyromegaly.  No lymphadenopathy.   Back:     No CVA tenderness, no spinal tenderness  Lungs:     Clear to auscultation bilaterally, without wheezes, rhonchi or rales  Chest wall:    No tenderness to palpitation  Heart:  Regular rate and rhythm without murmurs, gallops, rubs  Abdomen:     Soft, non-tender, nondistended, normal bowel sounds, no organomegaly  Genitalia:    R testicular fluid collection, hypospadius, penile prosthesis (though family is unaware of this when asked on history), indwelling foley catheter.  Rectal:    deferred  Extremities:   No clubbing, cyanosis or edema.  Pulses:   2+ and symmetric all extremities  Skin:   Skin color, texture, turgor normal, no rashes or lesions  Lymph nodes:   Cervical, supraclavicular, and axillary nodes normal  Neurologic:   Not able to get a good neuro exam on patient due to AMS.  Moved his right arm a little amount.    Labs on Admission:  Basic Metabolic Panel:  Recent  Labs Lab 06/10/13 2227  NA 142  K 3.2*  CL 95*  CO2 28  GLUCOSE 166*  BUN 29*  CREATININE 0.50  CALCIUM 9.4   Liver Function Tests:  Recent Labs Lab 06/10/13 2227  AST 12  ALT 8  ALKPHOS 96  BILITOT 2.5*  PROT 7.3  ALBUMIN 2.9*   No results found for this basename: LIPASE, AMYLASE,  in the last 168 hours No results found for this basename: AMMONIA,  in the last 168 hours CBC:  Recent Labs Lab 06/10/13 2227  WBC 26.8*  NEUTROABS 24.2*  HGB 13.1  HCT 39.0  MCV 97.3  PLT 283   Cardiac Enzymes: No results found for this basename: CKTOTAL, CKMB, CKMBINDEX, TROPONINI,  in the last 168 hours  BNP (last 3 results)  Recent Labs  03/27/13 1028 03/28/13 0202 03/31/13 0238  PROBNP 2511.0* 4057.0* 3242.0*  3318.0*   CBG: No results found for this basename: GLUCAP,  in the last 168 hours  Radiological Exams on Admission: [place] Scrotum  06/11/2013   CLINICAL DATA:  Penile trauma, pus at urethral meatus, RIGHT scrotal swelling, lump  EXAM: SCROTAL ULTRASOUND  DOPPLER ULTRASOUND OF THE TESTICLES  TECHNIQUE: Complete ultrasound examination of the testicles, epididymis, and other scrotal structures was performed. Color and spectral Doppler ultrasound were also utilized to evaluate blood flow to the testicles.  COMPARISON:  None  FINDINGS: Right testicle  Measurements: 4.7 x 2.1 x 3.5 cm. Normal echogenicity without mass or calcification. Internal blood flow present on color Doppler imaging.  Left testicle  Measurements: 4.0 x 1.8 x 2.9 cm. Smaller in size than RIGHT. Markedly heterogeneous echogenicity diffusely. No discrete mass or calcification. Internal blood flow present on color Doppler imaging.  Right epididymis:  Normal in size and appearance.  Left epididymis:  Normal in size and appearance.  Hydrocele: Large complex RIGHT hydrocele identified containing diffuse internal echogenicity; this could be due to blood, pus or debris. Tiny LEFT hydrocele identified.  Varicocele:   Absent bilaterally  Pulsed Doppler interrogation of both testes demonstrates low resistance arterial and venous waveforms bilaterally.  A at the base of the penis, between the testes at a site of palpable abnormality, a 2 anechoic tubular structures are identified, question extending to tubing, suspect penile prosthesis. An additional curvilinear echogenic focus with central hypo echogenicity is identified, with a larger 2.9 cm diameter rounded focus at the end, suspect reservoir to penile prosthesis.  IMPRESSION: Large complex fluid collection or on RIGHT testis, question hydrocele in this clinical setting though hematocele or less likely significant debris could cause a similar appearance.  Smaller markedly heterogeneous appearing LEFT testis question sequela of prior missed torsion, trauma or infection.  Artifacts at base of penis  and scrotum appear to represent foreign bodies, most suspicious for a penile prosthesis; recommend correlation with patient history to exclude other foreign bodies.   Electronically Signed   By: Ulyses Southward M.D.   On: 06/11/2013 02:45   [place] Art/ven Flow Abd Pelv Doppler  06/11/2013   CLINICAL DATA:  Penile trauma, pus at urethral meatus, RIGHT scrotal swelling, lump  EXAM: SCROTAL ULTRASOUND  DOPPLER ULTRASOUND OF THE TESTICLES  TECHNIQUE: Complete ultrasound examination of the testicles, epididymis, and other scrotal structures was performed. Color and spectral Doppler ultrasound were also utilized to evaluate blood flow to the testicles.  COMPARISON:  None  FINDINGS: Right testicle  Measurements: 4.7 x 2.1 x 3.5 cm. Normal echogenicity without mass or calcification. Internal blood flow present on color Doppler imaging.  Left testicle  Measurements: 4.0 x 1.8 x 2.9 cm. Smaller in size than RIGHT. Markedly heterogeneous echogenicity diffusely. No discrete mass or calcification. Internal blood flow present on color Doppler imaging.  Right epididymis:  Normal in size and appearance.   Left epididymis:  Normal in size and appearance.  Hydrocele: Large complex RIGHT hydrocele identified containing diffuse internal echogenicity; this could be due to blood, pus or debris. Tiny LEFT hydrocele identified.  Varicocele:  Absent bilaterally  Pulsed Doppler interrogation of both testes demonstrates low resistance arterial and venous waveforms bilaterally.  A at the base of the penis, between the testes at a site of palpable abnormality, a 2 anechoic tubular structures are identified, question extending to tubing, suspect penile prosthesis. An additional curvilinear echogenic focus with central hypo echogenicity is identified, with a larger 2.9 cm diameter rounded focus at the end, suspect reservoir to penile prosthesis.  IMPRESSION: Large complex fluid collection or on RIGHT testis, question hydrocele in this clinical setting though hematocele or less likely significant debris could cause a similar appearance.  Smaller markedly heterogeneous appearing LEFT testis question sequela of prior missed torsion, trauma or infection.  Artifacts at base of penis and scrotum appear to represent foreign bodies, most suspicious for a penile prosthesis; recommend correlation with patient history to exclude other foreign bodies.   Electronically Signed   By: Ulyses Southward M.D.   On: 06/11/2013 02:45    EKG: Independently reviewed.  Assessment/Plan Principal Problem:   Severe sepsis Active Problems:   CHF (congestive heart failure)   Sepsis secondary to UTI   1. Severe sepsis secondary to UTI - almost certianly due to urinary source given the grossly infected urinary appearance on admission and HPI as noted above.  Culture pending, on rocephin, did get a dose of vanc and clinda in the ED but will stop these as I strongly suspect a GN organism (nitrite positive urine).  Foley has been changed out, Urology has seen the patient in ED, please see their note, they do NOT feel that the testicular enlargement  represents abscess, they feel that this is hydrocele and do not feel any intervention is needed at this time.  Will go ahead and get CXR to make sure he doesn't also have a PNA but primary source appears to be urine at this point, admitting patient to SDU, IVF, ABx ordered, urine and blood cultures ordered.  Discussed with family, patient is DNI/DNR, and they know that he is very sick and with this infection there is a chance he might not survive this hospital stay.    Code Status: DNR/DNI  Family Communication: Son and daughter at bedside Disposition Plan: Admit to SDU   Time spent: 47  min  Hillary Bow [facility] Pager [phone]  If 7AM-7PM, please contact the day team taking care of the patient [email or web address] Password [email or web address] 06/11/2013, 5:12 AM

## 2013-06-11 NOTE — Progress Notes (Signed)
Pt has been a [facility] pt since 04-05-2013.  Family has been educated on ways to try and stop pt from pulling on catheter but family did not try any of the suggested methods.  RN offered to take catheter out but daughter did not want this as she felt it would be too difficult to change pt.  Daughter, son-in-law and their 2 sons live in the home with pt.  Pt has a hospice aide who goes to the home 2X a week.  Aide was going 3X a week but it was changed to 2 because family was frequently cancelling aide visits.  Daughter at bedside.  Spoke with daughter about discharge plans.  Daughter wants to take pt home once he is ready for discharge.  Daughter is aware and realistic about pt's terminal status.  [place], [place]  [phone] ext: [phone]

## 2013-06-11 NOTE — Consult Note (Signed)
  Patient seen and examined. Has a right hydrocele, iatrogenic hypospadias, indwelling foley catheter and an inflatable penile prosthesis with the pump in the left scrotum. There is no evidence of scrotal abscess. No need for any urologic intervention at this time.  Full consult note to follow.

## 2013-06-11 NOTE — Progress Notes (Signed)
[id number] Earlene Plater, RN, BSN, CCM  [phone]  Chart Reviewed for discharge and hospital needs.  Discharge needs at time of review: None present will follow for needs.  Review of patient progress due on [id number].

## 2013-06-11 NOTE — Progress Notes (Signed)
I agree with dietetic intern's note and interventions.  Levon Hedger MS, RD, LDN [phone] Pager [phone] After Hours Pager

## 2013-06-11 NOTE — Progress Notes (Signed)
INITIAL NUTRITION ASSESSMENT  DOCUMENTATION CODES Per approved criteria  -Severe malnutrition in the context of chronic illness   Pt meets criteria for severe MALNUTRITION in the context of chronic illness as evidenced by weight loss of 25% x 2 months and energy intake </=75% minimum estimated needs >/= 1 month.   INTERVENTION: Diet advancement per MD Once diet advanced, pt may need oral nutrition supplement RD to continue to follow nutrition care plan  NUTRITION DIAGNOSIS: Inadequate oral intake related to inability to eat as evidenced by NPO status.   Goal: Meet >/=90% of estimated nutrition needs   Monitor:  Diet advancement, weight trend, labs   Reason for Assessment: Positive Malnutrition Screening Tool Score, Low Braden   73 y.o. male  Admitting Dx: Severe sepsis  ASSESSMENT: Pt is a 73 y.o. male in hospice at baseline, brought in by family today with c/o penile bleeding, blood in urine, pus in catheter and urine as well as around the foley catheter, enlargement of his testicle.  Pt unable to communicate during assessment. Pt's son-in-law provided dietary history for pt. He stated that pt has lost weight since his last hospital stay in March. Stated that pt has not eaten well since being in hospice care starting in March. Pt eats yogurt and mashed potatoes but not much more than that. He was unsure of pt's usual body weight.   Per EPIC chart, pt has lost 59 lbs (25%) since March 2015, which is severe for time frame.   Nutrition Focused Physical Exam:  Subcutaneous Fat:  Orbital Region: wnl Upper Arm Region: wnl Thoracic and Lumbar Region: wnl  Muscle:  Temple Region: moderate depletion  Clavicle Bone Region: mild depletion  Clavicle and Acromion Bone Region: wnl Scapular Bone Region: n/a  Dorsal Hand: n/a Patellar Region: moderate depletion  Anterior Thigh Region: moderate depletion  Posterior Calf Region: wnl  Edema: RLE, LLE non-pitting edema   Low  potassium (3.2 mEq/L)  Elevated glucose (166 mg/dL)  Height: Ht Readings from Last 1 Encounters:  06/11/13 6' (1.829 m)    Weight: Wt Readings from Last 1 Encounters:  06/11/13 181 lb 14.1 oz (82.5 kg)    Ideal Body Weight: 80.9 kg   % Ideal Body Weight: 102%   Wt Readings from Last 10 Encounters:  06/11/13 181 lb 14.1 oz (82.5 kg)  04/05/13 240 lb 4.8 oz (109 kg)  03/20/13 252 lb (114.306 kg)  03/18/13 254 lb (115.214 kg)  03/11/13 256 lb (116.121 kg)  02/21/13 250 lb (113.399 kg)  12/17/12 248 lb (112.492 kg)  07/06/12 225 lb 8.5 oz (102.3 kg)    Usual Body Weight: 240 - 250 lbs   % Usual Body Weight: 75%  BMI:  Body mass index is 24.66 kg/(m^2)., Normal   Estimated Nutritional Needs: Kcal: 2000- 2200 Protein: 100 - 115 grams  Fluid: 2 L/day   Skin: Stage I Pressure Ulcer Medial Sacrum   Diet Order: NPO  EDUCATION NEEDS: -Education not appropriate at this time   Intake/Output Summary (Last 24 hours) at 06/11/13 0958 Last data filed at 06/11/13 0800  Gross per 24 hour  Intake 2512.5 ml  Output    825 ml  Net 1687.5 ml    Last BM: 5/26   Labs:   Recent Labs Lab 06/10/13 2227  NA 142  K 3.2*  CL 95*  CO2 28  BUN 29*  CREATININE 0.50  CALCIUM 9.4  GLUCOSE 166*    CBG (last 3)  No results found for  this basename: GLUCAP,  in the last 72 hours  Scheduled Meds: . sodium chloride   Intravenous Once  . antiseptic oral rinse  15 mL Mouth Rinse q12n4p  . [START ON 06/12/2013] cefTRIAXone (ROCEPHIN)  IV  1 g Intravenous Q24H  . chlorhexidine  15 mL Mouth Rinse BID  . Chlorhexidine Gluconate Cloth  6 each Topical Q0600  . mupirocin ointment  1 application Nasal BID  . sodium chloride  3 mL Intravenous Q12H    Continuous Infusions: . sodium chloride 125 mL/hr at 06/11/13 [id number]    Past Medical History  Diagnosis Date  . COPD (chronic obstructive pulmonary disease)   . CHF (congestive heart failure)   . Hypertension   . Atrial  fibrillation   . Interstitial lung disease   . Pacemaker   . Chest pain   . Dyspnea on exertion     Past Surgical History  Procedure Laterality Date  . Cholecystectomy    . Permanent pacemaker insertion      Eppie Gibsonebekah L Lasasha Brophy, BS Dietetic Intern Pager: [phone]

## 2013-06-12 DIAGNOSIS — E876 Hypokalemia: Secondary | ICD-10-CM

## 2013-06-12 DIAGNOSIS — E43 Unspecified severe protein-calorie malnutrition: Secondary | ICD-10-CM | POA: Insufficient documentation

## 2013-06-12 DIAGNOSIS — I509 Heart failure, unspecified: Secondary | ICD-10-CM

## 2013-06-12 DIAGNOSIS — N433 Hydrocele, unspecified: Secondary | ICD-10-CM

## 2013-06-12 DIAGNOSIS — I5032 Chronic diastolic (congestive) heart failure: Secondary | ICD-10-CM

## 2013-06-12 LAB — BASIC METABOLIC PANEL
BUN: 19 mg/dL (ref 6–23)
CALCIUM: 8.7 mg/dL (ref 8.4–10.5)
CO2: 25 meq/L (ref 19–32)
CREATININE: 0.42 mg/dL — AB (ref 0.50–1.35)
Chloride: 106 mEq/L (ref 96–112)
GFR calc Af Amer: >90 mL/min (ref >90)
Glucose, Bld: 103 mg/dL — ABNORMAL HIGH (ref 70–99)
Potassium: 3.1 mEq/L — ABNORMAL LOW (ref 3.7–5.3)
SODIUM: 146 meq/L (ref 137–147)

## 2013-06-12 LAB — CBC
HCT: 31.2 % — ABNORMAL LOW (ref 39.0–52.0)
Hemoglobin: 10.4 g/dL — ABNORMAL LOW (ref 13.0–17.0)
MCH: 33.5 pg (ref 26.0–34.0)
MCHC: 34 g/dL (ref 30.0–36.0)
MCV: 98.7 fL (ref 78.0–100.0)
PLATELETS: 212 10*3/uL (ref 150–400)
RBC: 3.16 MIL/uL — AB (ref 4.22–5.81)
RDW: 15.9 % — ABNORMAL HIGH (ref 11.5–15.5)
WBC: 14.6 10*3/uL — ABNORMAL HIGH (ref 4.0–10.5)

## 2013-06-12 MED ORDER — POTASSIUM CHLORIDE CRYS ER 20 MEQ PO TBCR
40.0000 meq | EXTENDED_RELEASE_TABLET | Freq: Once | ORAL | Status: AC
  Administered 2013-06-12: 40 meq via ORAL
  Filled 2013-06-12: qty 2

## 2013-06-12 MED ORDER — FUROSEMIDE 10 MG/ML IJ SOLN
40.0000 mg | Freq: Once | INTRAMUSCULAR | Status: AC
Start: 2013-06-12 — End: 2013-06-12
  Administered 2013-06-12: 40 mg via INTRAVENOUS
  Filled 2013-06-12 (×2): qty 4

## 2013-06-12 MED ORDER — MORPHINE SULFATE 2 MG/ML IJ SOLN
1.0000 mg | INTRAMUSCULAR | Status: DC | PRN
  Administered 2013-06-12: 1 mg via INTRAVENOUS
  Filled 2013-06-12: qty 1

## 2013-06-12 NOTE — Progress Notes (Signed)
PROGRESS NOTE    JHAMARI KOVALESKI TYO:[id number] DOB: July 26, 1940 DOA: 06/10/2013 PCP: Janell Quiet, FNP  HPI/Brief narrative 73 year old male on home hospice, history of COPD, chronic diastolic CHF, hypertension, atrial fibrillation, interstitial lung disease, pacemaker, admitted on 06/11/13 for complaints of blood in the urine, pus in urinary catheter and enlargement of scrotum. He was found to have UTI related sepsis. She was admitted to step down unit and after stabilization, transferred to telemetry.    Assessment/Plan:  1. Sepsis secondary to UTI: Patient had temperature of 99.36F and WBC 26.8 on admission. He was also mildly tachycardic on admission. She was hydrated with IV fluids and started empirically on IV Rocephin pending culture results. Foley catheter change in ED. Improved. 2. Complicated UTI/indwelling Foley catheter: Management as above. 3. Hypokalemia: Replace and follow BMP 4. Anemia: Possibly dilutional. Follow CBC. 5. Leukocytosis: Secondary to problem #1: Improving. Follow CBCs 6. History of chronic diastolic CHF: On 7/41, patient was noted to be congested while on IV fluids. Discontinued IV fluids and we'll provide a dose of IV Lasix and monitor. 7. COPD: Stable. 8. Hypertension: Controlled. 9. History of atrial fibrillation: Reasonably controlled with ventricular rate in the 100s. An episode of 5 beat NSVT on monitor. 10. History of dysphagia: Patient on pured diet at home-resume. 11. Right hydrocele, iatrogenic hypospadias, status post penile prosthesis: Urology consultation appreciated and recommend conservative management and do not see a need for urology intervention at this time. 12. Failure to thrive: Patient on home hospice. Return home with hospice when stable.    Code Status:  DO NOT RESUSCITATE  Family Communication:  discussed with patient's daughter at bedside.  Disposition Plan: Home with home hospice in the next 24-48 hours.     Consultants:   Urology   Procedures:   Foley catheter   Antibiotics:  IV Rocephin    Subjective:  patient mumbles incomprehensibly. No acute events per nursing. As per family, mental status at baseline.   Objective: Filed Vitals:   06/11/13 2018 06/12/13 0523 06/12/13 1425 06/12/13 1609  BP: 135/88 140/83 141/81   Pulse: 103 98 106   Temp: 97.8 F (36.6 C) 97.8 F (36.6 C) 97.9 F (36.6 C)   TempSrc: Oral Axillary Axillary   Resp: 18 17 16    Height:      Weight:      SpO2: 98% 98% 100% 98%    Intake/Output Summary (Last 24 hours) at 06/12/13 1733 Last data filed at 06/12/13 1551  Gross per 24 hour  Intake 3551.25 ml  Output    726 ml  Net 2825.25 ml   Filed Weights   06/11/13 0617 06/11/13 1642  Weight: 82.5 kg (181 lb 14.1 oz) 82.1 kg (181 lb)     Exam:  General exam:  elderly frail chronically ill looking male lying comfortably in bed.  Respiratory system:  Diminished breath sounds bilaterally, especially in the bases with occasional basal crackles. No increased work of breathing. Cardiovascular system: S1 & S2 heard,  irregularly irregular . No JVD, murmurs, gallops, clicks. Trace ankle edema. Telemetry A. fib with ventricular rate in the 100s. A single episode of 5 beat NSVT.  Gastrointestinal system: Abdomen is nondistended, soft and nontender. Normal bowel sounds heard. Foley +  Central nervous system: Alert and oriented to self and partly to place . No focal neurological deficits. Extremities: Symmetric 5 x 5 power.   Data Reviewed: Basic Metabolic Panel:  Recent Labs Lab 06/10/13 2227 06/12/13 0348  NA 142  146  K 3.2* 3.1*  CL 95* 106  CO2 28 25  GLUCOSE 166* 103*  BUN 29* 19  CREATININE 0.50 0.42*  CALCIUM 9.4 8.7   Liver Function Tests:  Recent Labs Lab 06/10/13 2227  AST 12  ALT 8  ALKPHOS 96  BILITOT 2.5*  PROT 7.3  ALBUMIN 2.9*   No results found for this basename: LIPASE, AMYLASE,  in the last 168 hours No results  found for this basename: AMMONIA,  in the last 168 hours CBC:  Recent Labs Lab 06/10/13 2227 06/12/13 0348  WBC 26.8* 14.6*  NEUTROABS 24.2*  --   HGB 13.1 10.4*  HCT 39.0 31.2*  MCV 97.3 98.7  PLT 283 212   Cardiac Enzymes: No results found for this basename: CKTOTAL, CKMB, CKMBINDEX, TROPONINI,  in the last 168 hours BNP (last 3 results)  Recent Labs  03/27/13 1028 03/28/13 0202 03/31/13 0238  PROBNP 2511.0* 4057.0* 3242.0*  3318.0*   CBG: No results found for this basename: GLUCAP,  in the last 168 hours  Recent Results (from the past 240 hour(s))  URINE CULTURE     Status: None   Collection Time    06/10/13 10:47 PM      Result Value Ref Range Status   Specimen Description URINE, RANDOM   Final   Special Requests NONE   Final   Culture  Setup Time     Final   Value: 06/11/2013 04:12     Performed at [facility] Count PENDING   Incomplete   Culture     Final   Value: Culture reincubated for better growth     Performed at [facility]   Report Status PENDING   Incomplete  CULTURE, BLOOD (ROUTINE X 2)     Status: None   Collection Time    06/11/13  1:08 AM      Result Value Ref Range Status   Specimen Description BLOOD LEFT HAND   Final   Special Requests BOTTLES DRAWN AEROBIC AND ANAEROBIC 5CC   Final   Culture  Setup Time     Final   Value: 06/11/2013 04:00     Performed at [facility]   Culture     Final   Value:        BLOOD CULTURE RECEIVED NO GROWTH TO DATE CULTURE WILL BE HELD FOR 5 DAYS BEFORE ISSUING A FINAL NEGATIVE REPORT     Performed at [facility]   Report Status PENDING   Incomplete  CULTURE, BLOOD (ROUTINE X 2)     Status: None   Collection Time    06/11/13  1:08 AM      Result Value Ref Range Status   Specimen Description BLOOD RIGHT ANTECUBITAL   Final   Special Requests BOTTLES DRAWN AEROBIC AND ANAEROBIC 5CC   Final   Culture  Setup Time     Final   Value: 06/11/2013 04:00     Performed  at [facility]   Culture     Final   Value:        BLOOD CULTURE RECEIVED NO GROWTH TO DATE CULTURE WILL BE HELD FOR 5 DAYS BEFORE ISSUING A FINAL NEGATIVE REPORT     Performed at [facility]   Report Status PENDING   Incomplete  MRSA PCR SCREENING     Status: Abnormal   Collection Time    06/11/13  6:17 AM      Result  Value Ref Range Status   MRSA by PCR POSITIVE (*) NEGATIVE Final   Comment: RESULT CALLED TO, READ BACK BY AND VERIFIED WITH:     L. STERN RN AT 0755 ON 05.26.15 BY SHUEA                The GeneXpert MRSA Assay (FDA     approved for NASAL specimens     only), is one component of a     comprehensive MRSA colonization     surveillance program. It is not     intended to diagnose MRSA     infection nor to guide or     monitor treatment for     MRSA infections.         Studies: US Scrotum  06/11/2013   CLINICAL DATA:  Penile trauma, pus at urethral meatus, RIGHT scrotal swelling, lump  EXAM: SCROTAL ULTRASOUND  DOPPLER ULTRASOUND OF THE TESTICLES  TECHNIQUE: Complete ultrasound examination of the testicles, epididymis, and other scrotal structures was performed. Color and spectral Doppler ultrasound were also utilized to evaluate blood flow to the testicles.  COMPARISON:  None  FINDINGS: Right testicle  Measurements: 4.7 x 2.1 x 3.5 cm. Normal echogenicity without mass or calcification. Internal blood flow present on color Doppler imaging.  Left testicle  Measurements: 4.0 x 1.8 x 2.9 cm. Smaller in size than RIGHT. Markedly heterogeneous echogenicity diffusely. No discrete mass or calcification. Internal blood flow present on color Doppler imaging.  Right epididymis:  Normal in size and appearance.  Left epididymis:  Normal in size and appearance.  Hydrocele: Large complex RIGHT hydrocele identified containing diffuse internal echogenicity; this could be due to blood, pus or debris. Tiny LEFT hydrocele identified.  Varicocele:  Absent bilaterally  Pulsed  Doppler interrogation of both testes demonstrates low resistance arterial and venous waveforms bilaterally.  A at the base of the penis, between the testes at a site of palpable abnormality, a 2 anechoic tubular structures are identified, question extending to tubing, suspect penile prosthesis. An additional curvilinear echogenic focus with central hypo echogenicity is identified, with a larger 2.9 cm diameter rounded focus at the end, suspect reservoir to penile prosthesis.  IMPRESSION: Large complex fluid collection or on RIGHT testis, question hydrocele in this clinical setting though hematocele or less likely significant debris could cause a similar appearance.  Smaller markedly heterogeneous appearing LEFT testis question sequela of prior missed torsion, trauma or infection.  Artifacts at base of penis and scrotum appear to represent foreign bodies, most suspicious for a penile prosthesis; recommend correlation with patient history to exclude other foreign bodies.   Electronically Signed   By: Ulyses Southward M.D.   On: 06/11/2013 02:45   [place] Art/ven Flow Abd Pelv Doppler  06/11/2013   CLINICAL DATA:  Penile trauma, pus at urethral meatus, RIGHT scrotal swelling, lump  EXAM: SCROTAL ULTRASOUND  DOPPLER ULTRASOUND OF THE TESTICLES  TECHNIQUE: Complete ultrasound examination of the testicles, epididymis, and other scrotal structures was performed. Color and spectral Doppler ultrasound were also utilized to evaluate blood flow to the testicles.  COMPARISON:  None  FINDINGS: Right testicle  Measurements: 4.7 x 2.1 x 3.5 cm. Normal echogenicity without mass or calcification. Internal blood flow present on color Doppler imaging.  Left testicle  Measurements: 4.0 x 1.8 x 2.9 cm. Smaller in size than RIGHT. Markedly heterogeneous echogenicity diffusely. No discrete mass or calcification. Internal blood flow present on color Doppler imaging.  Right epididymis:  Normal in size and appearance.  Left epididymis:  Normal in  size and appearance.  Hydrocele: Large complex RIGHT hydrocele identified containing diffuse internal echogenicity; this could be due to blood, pus or debris. Tiny LEFT hydrocele identified.  Varicocele:  Absent bilaterally  Pulsed Doppler interrogation of both testes demonstrates low resistance arterial and venous waveforms bilaterally.  A at the base of the penis, between the testes at a site of palpable abnormality, a 2 anechoic tubular structures are identified, question extending to tubing, suspect penile prosthesis. An additional curvilinear echogenic focus with central hypo echogenicity is identified, with a larger 2.9 cm diameter rounded focus at the end, suspect reservoir to penile prosthesis.  IMPRESSION: Large complex fluid collection or on RIGHT testis, question hydrocele in this clinical setting though hematocele or less likely significant debris could cause a similar appearance.  Smaller markedly heterogeneous appearing LEFT testis question sequela of prior missed torsion, trauma or infection.  Artifacts at base of penis and scrotum appear to represent foreign bodies, most suspicious for a penile prosthesis; recommend correlation with patient history to exclude other foreign bodies.   Electronically Signed   By: Ulyses SouthwardMark  Boles M.D.   On: 06/11/2013 02:45   Dg Chest Port 1 View  06/11/2013   CLINICAL DATA:  Severe sepsis.  EXAM: PORTABLE CHEST - 1 VIEW  COMPARISON:  DG CHEST 1V PORT dated 04/01/2013; CT CHEST W/CM dated 07/03/2012  FINDINGS: The cardiac silhouette appears moderately enlarged, similar. Diffuse moderate interstitial prominence, mildly decreased without pleural effusions or focal consolidations. Mildly elevated right hemidiaphragm. No pneumothorax.  Single lead left cardiac pacemaker in situ. Multiple EKG lines overlie the patient and may obscure subtle underlying pathology. Moderate degenerative change of the thoracic spine.  IMPRESSION: Stable cardiomegaly, with mildly decreased  interstitial prominence could reflect resolving pulmonary edema on a background of chronic interstitial lung disease.   Electronically Signed   By: Awilda Metroourtnay  Bloomer   On: 06/11/2013 05:42        Scheduled Meds: . antiseptic oral rinse  15 mL Mouth Rinse q12n4p  . cefTRIAXone (ROCEPHIN)  IV  1 g Intravenous Q24H  . chlorhexidine  15 mL Mouth Rinse BID  . Chlorhexidine Gluconate Cloth  6 each Topical Q0600  . mupirocin ointment  1 application Nasal BID  . sodium chloride  3 mL Intravenous Q12H   Continuous Infusions:   Principal Problem:   Severe sepsis Active Problems:   CHF (congestive heart failure)   Sepsis secondary to UTI   Protein-calorie malnutrition, severe    Time spent: 30 minutes.    Elease EtienneAnand D Madilyne Tadlock, MD, FACP, [facility] HospitalFHM. [facility] Pager [phone]  If 7PM-7AM, please contact night-coverage [email or web address] Password [email or web address] 06/12/2013, 5:33 PM    LOS: 2 days

## 2013-06-12 NOTE — Progress Notes (Signed)
Last night pt had a 5 beat run of Vtach. NP on call was notified and new orders for 4 runs of K. Will continue to monitor.

## 2013-06-12 NOTE — Progress Notes (Signed)
Inpatient RN [facility]  Room 1436-[facility] RN Visit-Karen Merilynn Finland RN  Related admission to [facility] diagnosis of Idiopathic Pulmonary Fibrosis.  Pt is DNR  code.  OOF DNR in place in patient's home. Pt seen at bedside, lying in bed, eyes closed. Mittens on to prevent pt from pulling at foley catheter. Foley draining golden urine. Pt responded to voice, able to answer simple questions. Oriented to self, surroundings. Pt denied pain. Per chart review pt had 1mg  of IV morphine at 3:00am this morning. IVF running at 125cc/hr. He has had no po intake since admission. Discussed with staff RN Susie, who will f/u with Md for diet order. Daughter Dorisann Frames and son in law in after Clinical research associate had left the room. Discussed events leading up to this hospitalization. Patient able to communicate with them. Pt had denied pain but told his son in law that he had pain. Staff RN Susie to f/u.   Attending Md present and discussed results of ultrasounds with family, advised family that pt was receiving IV antibiotics for a urinary tract infection, also confirmed with family that pt is able to eat soft foods, MD to change pt from NPO to pureed diet. Plan is for patient to continue IV abt for the next 24hrs then MD will reassess for discharge. Dorisann Frames confirmed that the plan was to have the patient return home to continue hospice services.  Discussed home medications with both attending and staff RN Susie after conversation with Dorisann Frames. Dorisann Frames reports that she  has been giving liquid morphine 10mg  as needed and the haldol 4mg  Q 4 hrs PRN and lorazepam 1mg  Q4 hrs as needed for agitation/anxiety.  All medications are currently on the hospital MAR.  Patient's home medication and discharge summary are in place on shadow chart.   Please call [facility] @ [phone]-with any hospice needs.   Thank you. Hansel Starling, RN  [facility]  Hospice Liaison  [phone])

## 2013-06-12 NOTE — Progress Notes (Signed)
Subjective: Patient reports : Patient is non communicative Objective: Vital signs in last 24 hours: Temp:  [97.8 F (36.6 C)-98.4 F (36.9 C)] 97.8 F (36.6 C) (05/27 0523) Pulse Rate:  [77-103] 98 (05/27 0523) Resp:  [17-28] 17 (05/27 0523) BP: (107-151)/(49-88) 140/83 mmHg (05/27 0523) SpO2:  [97 %-100 %] 98 % (05/27 0523) Weight:  [82.1 kg (181 lb)] 82.1 kg (181 lb) (05/26 1642)  Intake/Output from previous day: 05/26 0701 - 05/27 0700 In: 3325 [I.V.:2875; IV Piggyback:450] Out: 746 [Urine:745; Stool:1] Intake/Output this shift:    Physical Exam:  General:Foley in place, draining well.  Still has purulent drainage around catheter Scrotum swollen secondary to hydrocele Urine culture: pending Lab Results:  Recent Labs  06/10/13 2227 06/12/13 0348  HGB 13.1 10.4*  HCT 39.0 31.2*   BMET  Recent Labs  06/10/13 2227 06/12/13 0348  NA 142 146  K 3.2* 3.1*  CL 95* 106  CO2 28 25  GLUCOSE 166* 103*  BUN 29* 19  CREATININE 0.50 0.42*  CALCIUM 9.4 8.7   No results found for this basename: LABPT, INR,  in the last 72 hours No results found for this basename: LABURIN,  in the last 72 hours Results for orders placed during the hospital encounter of 06/10/13  URINE CULTURE     Status: None   Collection Time    06/10/13 10:47 PM      Result Value Ref Range Status   Specimen Description URINE, RANDOM   Final   Special Requests NONE   Final   Culture  Setup Time     Final   Value: 06/11/2013 04:12     [facility]   Colony Count PENDING   Incomplete   Culture     Final   Value: Culture reincubated for better growth     [facility]   Report Status PENDING   Incomplete  CULTURE, BLOOD (ROUTINE X 2)     Status: None   Collection Time    06/11/13  1:08 AM      Result Value Ref Range Status   Specimen Description BLOOD LEFT HAND   Final   Special Requests BOTTLES DRAWN AEROBIC AND ANAEROBIC 5CC   Final   Culture  Setup  Time     Final   Value: 06/11/2013 04:00     [facility]   Culture     Final   Value:        BLOOD CULTURE RECEIVED NO GROWTH TO DATE CULTURE WILL BE HELD FOR 5 DAYS BEFORE ISSUING A FINAL NEGATIVE REPORT     [facility]   Report Status PENDING   Incomplete  CULTURE, BLOOD (ROUTINE X 2)     Status: None   Collection Time    06/11/13  1:08 AM      Result Value Ref Range Status   Specimen Description BLOOD RIGHT ANTECUBITAL   Final   Special Requests BOTTLES DRAWN AEROBIC AND ANAEROBIC 5CC   Final   Culture  Setup Time     Final   Value: 06/11/2013 04:00     [facility]   Culture     Final   Value:        BLOOD CULTURE RECEIVED NO GROWTH TO DATE CULTURE WILL BE HELD FOR 5 DAYS BEFORE ISSUING A FINAL NEGATIVE REPORT     [facility]   Report Status PENDING  Incomplete  MRSA PCR SCREENING     Status: Abnormal   Collection Time    06/11/13  6:17 AM      Result Value Ref Range Status   MRSA by PCR POSITIVE (*) NEGATIVE Final   Comment: RESULT CALLED TO, READ BACK BY AND VERIFIED WITH:     L. STERN RN AT 6045 ON 05.26.15 BY SHUEA                The GeneXpert MRSA Assay (FDA     approved for NASAL specimens     only), is one component of a     comprehensive MRSA colonization     surveillance program. It is not     intended to diagnose MRSA     infection nor to guide or     monitor treatment for     MRSA infections.    Studies/Results: US Scrotum  06/11/2013   CLINICAL DATA:  Penile trauma, pus at urethral meatus, RIGHT scrotal swelling, lump  EXAM: SCROTAL ULTRASOUND  DOPPLER ULTRASOUND OF THE TESTICLES  TECHNIQUE: Complete ultrasound examination of the testicles, epididymis, and other scrotal structures was performed. Color and spectral Doppler ultrasound were also utilized to evaluate blood flow to the testicles.  COMPARISON:  None  FINDINGS: Right testicle  Measurements: 4.7 x 2.1 x 3.5 cm.  Normal echogenicity without mass or calcification. Internal blood flow present on color Doppler imaging.  Left testicle  Measurements: 4.0 x 1.8 x 2.9 cm. Smaller in size than RIGHT. Markedly heterogeneous echogenicity diffusely. No discrete mass or calcification. Internal blood flow present on color Doppler imaging.  Right epididymis:  Normal in size and appearance.  Left epididymis:  Normal in size and appearance.  Hydrocele: Large complex RIGHT hydrocele identified containing diffuse internal echogenicity; this could be due to blood, pus or debris. Tiny LEFT hydrocele identified.  Varicocele:  Absent bilaterally  Pulsed Doppler interrogation of both testes demonstrates low resistance arterial and venous waveforms bilaterally.  A at the base of the penis, between the testes at a site of palpable abnormality, a 2 anechoic tubular structures are identified, question extending to tubing, suspect penile prosthesis. An additional curvilinear echogenic focus with central hypo echogenicity is identified, with a larger 2.9 cm diameter rounded focus at the end, suspect reservoir to penile prosthesis.  IMPRESSION: Large complex fluid collection or on RIGHT testis, question hydrocele in this clinical setting though hematocele or less likely significant debris could cause a similar appearance.  Smaller markedly heterogeneous appearing LEFT testis question sequela of prior missed torsion, trauma or infection.  Artifacts at base of penis and scrotum appear to represent foreign bodies, most suspicious for a penile prosthesis; recommend correlation with patient history to exclude other foreign bodies.   Electronically Signed   By: Ulyses Southward M.D.   On: 06/11/2013 02:45   [place] Art/ven Flow Abd Pelv Doppler  06/11/2013   CLINICAL DATA:  Penile trauma, pus at urethral meatus, RIGHT scrotal swelling, lump  EXAM: SCROTAL ULTRASOUND  DOPPLER ULTRASOUND OF THE TESTICLES  TECHNIQUE: Complete ultrasound examination of the testicles,  epididymis, and other scrotal structures was performed. Color and spectral Doppler ultrasound were also utilized to evaluate blood flow to the testicles.  COMPARISON:  None  FINDINGS: Right testicle  Measurements: 4.7 x 2.1 x 3.5 cm. Normal echogenicity without mass or calcification. Internal blood flow present on color Doppler imaging.  Left testicle  Measurements: 4.0 x 1.8 x 2.9 cm. Smaller in size than RIGHT. Markedly heterogeneous  echogenicity diffusely. No discrete mass or calcification. Internal blood flow present on color Doppler imaging.  Right epididymis:  Normal in size and appearance.  Left epididymis:  Normal in size and appearance.  Hydrocele: Large complex RIGHT hydrocele identified containing diffuse internal echogenicity; this could be due to blood, pus or debris. Tiny LEFT hydrocele identified.  Varicocele:  Absent bilaterally  Pulsed Doppler interrogation of both testes demonstrates low resistance arterial and venous waveforms bilaterally.  A at the base of the penis, between the testes at a site of palpable abnormality, a 2 anechoic tubular structures are identified, question extending to tubing, suspect penile prosthesis. An additional curvilinear echogenic focus with central hypo echogenicity is identified, with a larger 2.9 cm diameter rounded focus at the end, suspect reservoir to penile prosthesis.  IMPRESSION: Large complex fluid collection or on RIGHT testis, question hydrocele in this clinical setting though hematocele or less likely significant debris could cause a similar appearance.  Smaller markedly heterogeneous appearing LEFT testis question sequela of prior missed torsion, trauma or infection.  Artifacts at base of penis and scrotum appear to represent foreign bodies, most suspicious for a penile prosthesis; recommend correlation with patient history to exclude other foreign bodies.   Electronically Signed   By: Ulyses Southward M.D.   On: 06/11/2013 02:45   Dg Chest Port 1  View  06/11/2013   CLINICAL DATA:  Severe sepsis.  EXAM: PORTABLE CHEST - 1 VIEW  COMPARISON:  DG CHEST 1V PORT dated 04/01/2013; CT CHEST W/CM dated 07/03/2012  FINDINGS: The cardiac silhouette appears moderately enlarged, similar. Diffuse moderate interstitial prominence, mildly decreased without pleural effusions or focal consolidations. Mildly elevated right hemidiaphragm. No pneumothorax.  Single lead left cardiac pacemaker in situ. Multiple EKG lines overlie the patient and may obscure subtle underlying pathology. Moderate degenerative change of the thoracic spine.  IMPRESSION: Stable cardiomegaly, with mildly decreased interstitial prominence could reflect resolving pulmonary edema on a background of chronic interstitial lung disease.   Electronically Signed   By: Awilda Metro   On: 06/11/2013 05:42    Assessment/Plan:  Right hydrocele.  Iatrogenic hypospadias.  UTI  Continue palliative care   LOS: 2 days   Danae Chen 06/12/2013, 8:54 AM

## 2013-06-13 ENCOUNTER — Inpatient Hospital Stay (HOSPITAL_COMMUNITY)

## 2013-06-13 DIAGNOSIS — J449 Chronic obstructive pulmonary disease, unspecified: Secondary | ICD-10-CM

## 2013-06-13 DIAGNOSIS — R0989 Other specified symptoms and signs involving the circulatory and respiratory systems: Secondary | ICD-10-CM

## 2013-06-13 DIAGNOSIS — R0609 Other forms of dyspnea: Secondary | ICD-10-CM

## 2013-06-13 LAB — BASIC METABOLIC PANEL
BUN: 14 mg/dL (ref 6–23)
CALCIUM: 8.7 mg/dL (ref 8.4–10.5)
CO2: 28 mEq/L (ref 19–32)
CREATININE: 0.47 mg/dL — AB (ref 0.50–1.35)
Chloride: 109 mEq/L (ref 96–112)
GFR calc non Af Amer: >90 mL/min (ref >90)
Glucose, Bld: 120 mg/dL — ABNORMAL HIGH (ref 70–99)
Potassium: 2.8 mEq/L — CL (ref 3.7–5.3)
SODIUM: 151 meq/L — AB (ref 137–147)

## 2013-06-13 LAB — CBC
HCT: 33.5 % — ABNORMAL LOW (ref 39.0–52.0)
Hemoglobin: 10.7 g/dL — ABNORMAL LOW (ref 13.0–17.0)
MCH: 31.9 pg (ref 26.0–34.0)
MCHC: 31.9 g/dL (ref 30.0–36.0)
MCV: 100 fL (ref 78.0–100.0)
Platelets: 179 10*3/uL (ref 150–400)
RBC: 3.35 MIL/uL — ABNORMAL LOW (ref 4.22–5.81)
RDW: 16 % — AB (ref 11.5–15.5)
WBC: 11.8 10*3/uL — ABNORMAL HIGH (ref 4.0–10.5)

## 2013-06-13 LAB — URINE CULTURE: Colony Count: >=100000

## 2013-06-13 MED ORDER — POTASSIUM CHLORIDE 10 MEQ/100ML IV SOLN
10.0000 meq | INTRAVENOUS | Status: AC
  Administered 2013-06-13 (×6): 10 meq via INTRAVENOUS
  Filled 2013-06-13 (×6): qty 100

## 2013-06-13 MED ORDER — FUROSEMIDE 10 MG/ML IJ SOLN
40.0000 mg | Freq: Once | INTRAMUSCULAR | Status: AC
  Administered 2013-06-13: 40 mg via INTRAVENOUS
  Filled 2013-06-13: qty 4

## 2013-06-13 NOTE — Progress Notes (Signed)
Subjective: Patient reports Sleeping.   Objective: Vital signs in last 24 hours: Temp:  [98.1 F (36.7 C)-98.4 F (36.9 C)] 98.1 F (36.7 C) (05/28 1615) Pulse Rate:  [90-109] 90 (05/28 1615) Resp:  [26-28] 28 (05/28 1615) BP: (126-145)/(73-84) 126/73 mmHg (05/28 1615) SpO2:  [100 %] 100 % (05/28 1615)  Intake/Output from previous day: 05/27 0701 - 05/28 0700 In: 1501.3 [P.O.:120; I.V.:1231.3; IV Piggyback:150] Out: 3976 [Urine:3975; Stool:1] Intake/Output this shift:    Physical Exam:  General:Less drainage around Foley.  Catheter draining well.  Urine grossly clear. Urine culture: E. Coli and Klebsiella sensitive to Rocephin.  Lab Results:  Recent Labs  06/10/13 2227 06/12/13 0348 06/13/13 0337  HGB 13.1 10.4* 10.7*  HCT 39.0 31.2* 33.5*   BMET  Recent Labs  06/12/13 0348 06/13/13 0337  NA 146 151*  K 3.1* 2.8*  CL 106 109  CO2 25 28  GLUCOSE 103* 120*  BUN 19 14  CREATININE 0.42* 0.47*  CALCIUM 8.7 8.7   No results found for this basename: LABPT, INR,  in the last 72 hours No results found for this basename: LABURIN,  in the last 72 hours Results for orders placed during the hospital encounter of 06/10/13  URINE CULTURE     Status: None   Collection Time    06/10/13 10:47 PM      Result Value Ref Range Status   Specimen Description URINE, RANDOM   Final   Special Requests NONE   Final   Culture  Setup Time     Final   Value: 06/11/2013 04:12     Performed at [facility] Count     Final   Value: >=100,000 COLONIES/ML     Performed at [facility]   Culture     Final   Value: ESCHERICHIA COLI     KLEBSIELLA PNEUMONIAE     Performed at [facility]   Report Status 06/13/2013 FINAL   Final   Organism ID, Bacteria ESCHERICHIA COLI   Final   Organism ID, Bacteria KLEBSIELLA PNEUMONIAE   Final  CULTURE, BLOOD (ROUTINE X 2)     Status: None   Collection Time    06/11/13  1:08 AM      Result Value Ref Range  Status   Specimen Description BLOOD LEFT HAND   Final   Special Requests BOTTLES DRAWN AEROBIC AND ANAEROBIC 5CC   Final   Culture  Setup Time     Final   Value: 06/11/2013 04:00     Performed at [facility]   Culture     Final   Value:        BLOOD CULTURE RECEIVED NO GROWTH TO DATE CULTURE WILL BE HELD FOR 5 DAYS BEFORE ISSUING A FINAL NEGATIVE REPORT     Performed at [facility]   Report Status PENDING   Incomplete  CULTURE, BLOOD (ROUTINE X 2)     Status: None   Collection Time    06/11/13  1:08 AM      Result Value Ref Range Status   Specimen Description BLOOD RIGHT ANTECUBITAL   Final   Special Requests BOTTLES DRAWN AEROBIC AND ANAEROBIC 5CC   Final   Culture  Setup Time     Final   Value: 06/11/2013 04:00     Performed at [facility]   Culture     Final   Value:        BLOOD CULTURE RECEIVED  NO GROWTH TO DATE CULTURE WILL BE HELD FOR 5 DAYS BEFORE ISSUING A FINAL NEGATIVE REPORT     [facility]   Report Status PENDING   Incomplete  MRSA PCR SCREENING     Status: Abnormal   Collection Time    06/11/13  6:17 AM      Result Value Ref Range Status   MRSA by PCR POSITIVE (*) NEGATIVE Final   Comment: RESULT CALLED TO, READ BACK BY AND VERIFIED WITH:     L. STERN RN AT [id number] ON 05.26.15 BY SHUEA                The GeneXpert MRSA Assay (FDA     approved for NASAL specimens     only), is one component of a     comprehensive MRSA colonization     surveillance program. It is not     intended to diagnose MRSA     infection nor to guide or     monitor treatment for     MRSA infections.    Studies/Results: Dg Chest Port 1 View  06/13/2013   CLINICAL DATA:  Adventitious lungs sounds.  Shortness of breath.  EXAM: PORTABLE CHEST - 1 VIEW  COMPARISON:  Chest radiograph performed 06/11/2013  FINDINGS: There has been mild interval improvement in diffuse bilateral interstitial opacification. This appears to reflect the patient's chronic  underlying interstitial lung disease, though minimal residual edema cannot be excluded. No definite pleural effusion or pneumothorax is seen.  The cardiomediastinal silhouette is borderline normal in size. A pacemaker is seen overlying the left chest wall, with a single lead ending overlying the right ventricle. No acute osseous abnormalities are identified.  IMPRESSION: Mild interval improvement in diffuse bilateral airspace opacification. This appears to reflect the patient's chronic underlying interstitial lung disease, though minimal residual edema cannot be excluded.   Electronically Signed   By: Roanna RaiderJeffery  Chang M.D.   On: 06/13/2013 04:39    Assessment/Plan:  UTI.  Right hydrocele.  Hypospadias  Palliative care.  Treat UTI only if symptomatic.  Will sign off.  Call if needed.   LOS: 3 days   Danae ChenMarc H Daryl Quiros 06/13/2013, 9:13 PM

## 2013-06-13 NOTE — Progress Notes (Signed)
Encouraged patient to eat, but just pocketed food.  Patient rested all day and will continue to monitor.

## 2013-06-13 NOTE — Progress Notes (Signed)
Pt resting comfortably.  No family present.  Daughter wants pt to return home when ready for discharge.  Hospice homecare will continue to follow pt once he is discharged. Wynonia Hazard, LCSW

## 2013-06-13 NOTE — Progress Notes (Signed)
This morning, patient's lungs sounded more wet. His vitals were 138/84. HR 109. Respirations 28, and oxygen 100% on 2L. NP oncall notified and new orders were given for a chest xray. After results came back, NP then ordered a one time dose of lasix. Will continue to monitor.

## 2013-06-13 NOTE — Progress Notes (Signed)
PROGRESS NOTE    Billy Casey ZOX:[id number] DOB: 1940-10-18 DOA: 06/10/2013 PCP: Janell QuietAMPBELL, PADONDA BOYD, FNP  HPI/Brief narrative 73 year old male on home hospice, history of COPD, chronic diastolic CHF, hypertension, atrial fibrillation, interstitial lung disease, pacemaker, admitted on 06/11/13 for complaints of blood in the urine, pus in urinary catheter and enlargement of scrotum. He was found to have UTI related sepsis. She was admitted to step down unit and after stabilization, transferred to telemetry.    Assessment/Plan:  1. Sepsis secondary to UTI: Patient had temperature of 99.32F and WBC 26.8 on admission. He was also mildly tachycardic on admission. She was hydrated with IV fluids and started empirically on IV Rocephin pending culture results. Foley catheter change in ED. Improved. 2. Escherichia coli Complicated UTI/indwelling Foley catheter: Management as above. 3. Hypokalemia: as discussed with patient's daughter on 5/28, focuses for comfort and requests no further labs and no further Lasix or potassium replacement. 4. Anemia: Possibly dilutional. No lab draws 5. Leukocytosis: Secondary to problem #1: Improving. No lab draws 6. History of chronic diastolic CHF: On 8/115/27, patient was noted to be congested while on IV fluids. Discontinued IV fluids treated with when necessary doses of IV Lasix. However as discussed with daughter on 5/28, no further Lasix or potassium. If patient has dyspnea then treat for comfort with when necessary morphine and Ativan. Patient also has history of COPD and pulmonary fibrosis which may be contributing to his wet sounding lungs and dyspnea.  7. COPD: Stable. 8. Hypertension: Controlled. 9. History of atrial fibrillation: Reasonably controlled with ventricular rate in the 100s. An episode of 5 beat NSVT on monitor. Telemetry has been discontinued. 10. History of dysphagia: Patient on pured diet at home-resume. 11. Right hydrocele, iatrogenic  hypospadias, status post penile prosthesis: Urology consultation appreciated and recommend conservative management and do not see a need for urology intervention at this time. 12. Failure to thrive: Patient on home hospice. Return home with hospice when stable.    Code Status:  DO NOT RESUSCITATE  Family Communication:  discussed with patient's daughter. Disposition Plan: Home with home hospice 5/29.    Consultants:   Urology   Procedures:   Foley catheter   Antibiotics:  IV Rocephin    Subjective: Barely responsive this morning. Mumbles incomprehensibly.  Objective: Filed Vitals:   06/13/13 0025 06/13/13 0356 06/13/13 0514 06/13/13 1615  BP: 139/80 138/84 145/75 126/73  Pulse: 90 109 96 90  Temp:  98.4 F (36.9 C) 98.2 F (36.8 C) 98.1 F (36.7 C)  TempSrc:  Oral Oral Oral  Resp: 28 28 26 28   Height:      Weight:      SpO2: 100% 100% 100% 100%    Intake/Output Summary (Last 24 hours) at 06/13/13 1730 Last data filed at 06/13/13 1617  Gross per 24 hour  Intake     50 ml  Output   4451 ml  Net  -4401 ml   Filed Weights   06/11/13 0617 06/11/13 1642  Weight: 82.5 kg (181 lb 14.1 oz) 82.1 kg (181 lb)     Exam:  General exam:  elderly frail chronically ill looking male lying comfortably in bed.  Respiratory system:  Diminished breath sounds bilaterally, especially in the bases with few basal crackles. No increased work of breathing. Cardiovascular system: S1 & S2 heard,  irregularly irregular . No JVD, murmurs, gallops, clicks. Trace ankle edema. Gastrointestinal system: Abdomen is nondistended, soft and nontender. Normal bowel sounds heard. Foley +  Central  nervous system: Sleeping this morning mumbles incomprehensibly to call . No focal neurological deficits. Extremities: Symmetric 5 x 5 power.   Data Reviewed: Basic Metabolic Panel:  Recent Labs Lab 06/10/13 2227 06/12/13 0348 06/13/13 0337  NA 142 146 151*  K 3.2* 3.1* 2.8*  CL 95* 106 109  CO2  28 25 28   GLUCOSE 166* 103* 120*  BUN 29* 19 14  CREATININE 0.50 0.42* 0.47*  CALCIUM 9.4 8.7 8.7   Liver Function Tests:  Recent Labs Lab 06/10/13 2227  AST 12  ALT 8  ALKPHOS 96  BILITOT 2.5*  PROT 7.3  ALBUMIN 2.9*   No results found for this basename: LIPASE, AMYLASE,  in the last 168 hours No results found for this basename: AMMONIA,  in the last 168 hours CBC:  Recent Labs Lab 06/10/13 2227 06/12/13 0348 06/13/13 0337  WBC 26.8* 14.6* 11.8*  NEUTROABS 24.2*  --   --   HGB 13.1 10.4* 10.7*  HCT 39.0 31.2* 33.5*  MCV 97.3 98.7 100.0  PLT 283 212 179   Cardiac Enzymes: No results found for this basename: CKTOTAL, CKMB, CKMBINDEX, TROPONINI,  in the last 168 hours BNP (last 3 results)  Recent Labs  03/27/13 1028 03/28/13 0202 03/31/13 0238  PROBNP 2511.0* 4057.0* 3242.0*  3318.0*   CBG: No results found for this basename: GLUCAP,  in the last 168 hours  Recent Results (from the past 240 hour(s))  URINE CULTURE     Status: None   Collection Time    06/10/13 10:47 PM      Result Value Ref Range Status   Specimen Description URINE, RANDOM   Final   Special Requests NONE   Final   Culture  Setup Time     Final   Value: 06/11/2013 04:12     Performed at [facility] Count     Final   Value: >=100,000 COLONIES/ML     Performed at [facility]   Culture     Final   Value: ESCHERICHIA COLI     KLEBSIELLA PNEUMONIAE     Performed at [facility]   Report Status 06/13/2013 FINAL   Final   Organism ID, Bacteria ESCHERICHIA COLI   Final   Organism ID, Bacteria KLEBSIELLA PNEUMONIAE   Final  CULTURE, BLOOD (ROUTINE X 2)     Status: None   Collection Time    06/11/13  1:08 AM      Result Value Ref Range Status   Specimen Description BLOOD LEFT HAND   Final   Special Requests BOTTLES DRAWN AEROBIC AND ANAEROBIC 5CC   Final   Culture  Setup Time     Final   Value: 06/11/2013 04:00     Performed at [facility]    Culture     Final   Value:        BLOOD CULTURE RECEIVED NO GROWTH TO DATE CULTURE WILL BE HELD FOR 5 DAYS BEFORE ISSUING A FINAL NEGATIVE REPORT     Performed at [facility]   Report Status PENDING   Incomplete  CULTURE, BLOOD (ROUTINE X 2)     Status: None   Collection Time    06/11/13  1:08 AM      Result Value Ref Range Status   Specimen Description BLOOD RIGHT ANTECUBITAL   Final   Special Requests BOTTLES DRAWN AEROBIC AND ANAEROBIC 5CC   Final   Culture  Setup Time  Final   Value: 06/11/2013 04:00     Performed at [facility]   Culture     Final   Value:        BLOOD CULTURE RECEIVED NO GROWTH TO DATE CULTURE WILL BE HELD FOR 5 DAYS BEFORE ISSUING A FINAL NEGATIVE REPORT     Performed at [facility]   Report Status PENDING   Incomplete  MRSA PCR SCREENING     Status: Abnormal   Collection Time    06/11/13  6:17 AM      Result Value Ref Range Status   MRSA by PCR POSITIVE (*) NEGATIVE Final   Comment: RESULT CALLED TO, READ BACK BY AND VERIFIED WITH:     L. STERN RN AT 1610 ON 05.26.15 BY SHUEA                The GeneXpert MRSA Assay (FDA     approved for NASAL specimens     only), is one component of a     comprehensive MRSA colonization     surveillance program. It is not     intended to diagnose MRSA     infection nor to guide or     monitor treatment for     MRSA infections.         Studies: Dg Chest Port 1 View  06/13/2013   CLINICAL DATA:  Adventitious lungs sounds.  Shortness of breath.  EXAM: PORTABLE CHEST - 1 VIEW  COMPARISON:  Chest radiograph performed 06/11/2013  FINDINGS: There has been mild interval improvement in diffuse bilateral interstitial opacification. This appears to reflect the patient's chronic underlying interstitial lung disease, though minimal residual edema cannot be excluded. No definite pleural effusion or pneumothorax is seen.  The cardiomediastinal silhouette is borderline normal in size. A pacemaker is  seen overlying the left chest wall, with a single lead ending overlying the right ventricle. No acute osseous abnormalities are identified.  IMPRESSION: Mild interval improvement in diffuse bilateral airspace opacification. This appears to reflect the patient's chronic underlying interstitial lung disease, though minimal residual edema cannot be excluded.   Electronically Signed   By: Roanna Raider M.D.   On: 06/13/2013 04:39        Scheduled Meds: . antiseptic oral rinse  15 mL Mouth Rinse q12n4p  . cefTRIAXone (ROCEPHIN)  IV  1 g Intravenous Q24H  . chlorhexidine  15 mL Mouth Rinse BID  . Chlorhexidine Gluconate Cloth  6 each Topical Q0600  . mupirocin ointment  1 application Nasal BID  . sodium chloride  3 mL Intravenous Q12H   Continuous Infusions:   Principal Problem:   Severe sepsis Active Problems:   CHF (congestive heart failure)   Sepsis secondary to UTI   Protein-calorie malnutrition, severe    Time spent: 30 minutes.    Elease Etienne, MD, FACP, [facility]. [facility] Pager [phone]  If 7PM-7AM, please contact night-coverage [email or web address] Password [email or web address] 06/13/2013, 5:30 PM    LOS: 3 days

## 2013-06-13 NOTE — Plan of Care (Signed)
Problem: Phase I Progression Outcomes Goal: OOB as tolerated unless otherwise ordered Outcome: Not Met (add Reason) Comfort care  Problem: Phase II Progression Outcomes Goal: Progress activity as tolerated unless otherwise ordered Outcome: Not Met (add Reason) Comfort care

## 2013-06-13 NOTE — Progress Notes (Signed)
CRITICAL VALUE ALERT  Critical value received:  Potassium 2.8  Date of notification: 06/13/13  Time of notification:  0423  Critical value read back:yes  Nurse who received alert:  Linward Natal, RN  MD notified (1st page):  Craige Cotta  Time of first page:  [phone]  MD notified (2nd page):  Time of second page:  Responding MD:  Craige Cotta  Time MD responded:  0510  New orders put in computer by NP.

## 2013-06-13 NOTE — Progress Notes (Signed)
Inpatient RN visit-Kamuela Kelly [facility] 4W Room-1436 -[facility] RN Visit-Karen Merilynn Finland RN  Related admission to [facility] diagnosis of idiopathic pulmonary fibrosis. Pt is DNR code.     Pt seen at bedside, lying in bed, eyes closed, minimal response to verbal stimuli. When questioned about pain, pt did say "yes", unable to answer where. No nonverbal s/s of discomfort noted. Staff RN Susie notified and will follow up.  Patient with occasional wet sounding cough, head of bed elevated. Per Staff RN Susie pt was able to eat bites of yogurt yesterday, but did not swallow at dinner and had to have the food removed from his mouth.  Per chart review pt had CXR last evening for "lungs sounding more wet", pt has recvd 2 doses of IV lasix and is currently receiving IV potassium.  Writer spoke with attending MD Dr. Waymon Amato, he plans to contact family for determination of any further interventions.   Discharge date undetermined at this time. No family present at time of visit.   [facility] will continue to follow through discharge. Patient's home medication list and transfer summary in place on shadow chart.   Please call [facility] @ [phone]-  with any hospice needs.   Thank you. Hansel Starling, RN  [facility]  Hospice Liaison  [phone])

## 2013-06-14 DIAGNOSIS — J9610 Chronic respiratory failure, unspecified whether with hypoxia or hypercapnia: Secondary | ICD-10-CM

## 2013-06-14 MED ORDER — CIPROFLOXACIN HCL 500 MG PO TABS: 500.0000 mg | ORAL_TABLET | Freq: Two times a day (BID) | ORAL | Status: AC

## 2013-06-14 NOTE — Progress Notes (Signed)
Inpatient RN Doil Balfanz [facility] 4W  Room 1436-[facility] RN Visit-Karen Merilynn Finland RN  Related admission to [facility] diagnosis of idopathic pulmonary fibrosis. Pt is DNR code, OOF DNR in place in pt's home.     Pt seen at bedside, daughter and son in law present. Pt with increased RR of 44, eyes glazed, skin warm. Per chart review, pt has had no po intake in past 24hrs.  Pt appears to have made significant changes since yesterday. Family voiced their observance of changes in patient condition as well. Staff RN Susie notified by Clinical research associate of increased RR, she agreed to administration of liquid morphine and lorazepam as ordered. Writer present when medications given, pt is no longer swallowing, meds given sublingual. Writer spoke with attending, Dr. Waymon Amato, staff RN Susie and with family regarding change in patient condition/decline. Family confirmed decision to have patient transfer home via [facility] today. Family voiced understanding of writers assessment that pt maybe in the early active phase of dying. Emotional support offered. Writer confirmed OOF DNR on pt chart for transport. [facility] home care team notified of pt condition and plan for discharge home.  Patient's home medication list and transfer summary in place on shadow chart.   Please call [facility] @ [phone]-  with any hospice needs.   Thank you. Hansel Starling, RN  [facility]  Hospice Liaison  [phone])

## 2013-06-14 NOTE — Progress Notes (Signed)
Uneventful night for patient. Pt was turned and adjusted and given pain meds at beginning of shift. Otherwise patient appeared calm and did not need any medications.  Thersa Salt, RN, BSN 6:40 AM 06/14/2013

## 2013-06-14 NOTE — Discharge Summary (Signed)
Physician Discharge Summary  TRACKER MULLINGS HQI:[id number] DOB: Oct 01, 1940 DOA: 06/10/2013  PCP: Janell Quiet, FNP  Admit date: 06/10/2013 Discharge date: 06/14/2013  Time spent: less than 30 minutes  Recommendations for Outpatient Follow-up:  1. Discharge home with home hospice.  Discharge Diagnoses:  Principal Problem:   Severe sepsis Active Problems:   CHF (congestive heart failure)   Sepsis secondary to UTI   Protein-calorie malnutrition, severe   Discharge Condition: Improved & Stable  Diet recommendation: Dysphagia 1 diet and thin liquids.  Filed Weights   06/11/13 0617 06/11/13 1642  Weight: 82.5 kg (181 lb 14.1 oz) 82.1 kg (181 lb)    History of present illness:  73 year old male on home hospice, history of COPD, chronic diastolic CHF, hypertension, atrial fibrillation, interstitial lung disease, pacemaker, admitted on 06/11/13 for complaints of blood in the urine, pus in urinary catheter and enlargement of scrotum. He was found to have UTI related sepsis. He was admitted to step down unit and after stabilization, transferred to telemetry.    Hospital Course:   1. Sepsis secondary to UTI: Patient had temperature of 99.39F and WBC 26.8 on admission. He was also mildly tachycardic on admission. He was hydrated with IV fluids and started empirically on IV Rocephin pending culture results. Foley catheter change in ED. Continues to progressively declined despite treatment. 2. Escherichia coli and Klebsiella, Complicated UTI/indwelling Foley catheter: Patient was treated with IV Rocephin in the hospital. As per family, he used to to this Foley catheter probably secondary to UTI discomfort. Will be discharged on by mouth antibiotics to complete total 10 days course. Advised the daughter that his urinary tract infection is likely to recur given indwelling Foley catheter. She verbalized understanding. 3. Hypokalemia: as discussed with patient's daughter on 5/28, focuses  for comfort and requests no further labs and no further Lasix or potassium replacement. 4. Anemia: Possibly dilutional. No lab draws 5. Leukocytosis: Secondary to problem #1: Improving. No lab draws 6. History of chronic diastolic CHF: On 1/32, patient was noted to be congested while on IV fluids. Discontinued IV fluids treated with when necessary doses of IV Lasix. However as discussed with daughter on 5/28, no further Lasix or potassium. If patient has dyspnea then treat for comfort with when necessary morphine and Ativan. Patient also has history of COPD and pulmonary fibrosis which may be contributing to his wet sounding lungs and dyspnea.  7. COPD: Stable. 8. Hypertension: Controlled. 9. History of atrial fibrillation: Reasonably controlled with ventricular rate in the 100s. An episode of 5 beat NSVT on monitor. Telemetry has been discontinued. 10. History of dysphagia: Patient on pured diet at home-resume. Patient has not eaten much in the last 24 hours. 11. Right hydrocele, iatrogenic hypospadias, status post penile prosthesis: Urology consultation appreciated and recommend conservative management and do not see a need for urology intervention at this time. 12. Failure to thrive: Patient on home hospice. As per today's evaluation, patient seems to be rapidly declining and may be imminently dying. Discussed at length with patient's daughter, son-in-law in the presence of hospice team. Family wishes to take patient home and anticipate home device.     Consultations:  Urology  Procedures:  Foley catheter    Discharge Exam:  Complaints:  Unresponsive and nonverbal. Intermittent rapid breathing. Does not appear in pain.  Filed Vitals:   06/13/13 0514 06/13/13 1615 06/13/13 2145 06/14/13 0640  BP: 145/75 126/73 121/75 119/79  Pulse: 96 90 89 87  Temp: 98.2 F (36.8 C)  98.1 F (36.7 C) 98.6 F (37 C) 98.3 F (36.8 C)  TempSrc: Oral Oral Oral Oral  Resp: 26 28 16 24   Height:       Weight:      SpO2: 100% 100% 100% 100%    General exam: elderly frail chronically ill looking male-does look worse than he did yesterday with intermittent rapid breathing, chest congestion but does not appear in pain. Eyes open and staring blankly. Respiratory system: Reduced breath sounds bilaterally with scattered bilateral crackles and occasional wheeze. Tachypneic. Cardiovascular system: S1 & S2 heard, irregularly irregular . No JVD, murmurs, gallops, clicks. Trace ankle edema. Gastrointestinal system: Abdomen is nondistended, soft and nontender. Normal bowel sounds heard. Foley +  Central nervous system: Unresponsive. No focal neurological deficits.  Extremities: Symmetric 5 x 5 power.   Discharge Instructions      Discharge Instructions   Bed rest    Complete by:  As directed      Call MD for:  difficulty breathing, headache or visual disturbances    Complete by:  As directed      Call MD for:  severe uncontrolled pain    Complete by:  As directed      Call MD for:  temperature >100.4    Complete by:  As directed      Discharge instructions    Complete by:  As directed   1). Continue home oxygen for comfort. 2) Continue indwelling urinary catheter. 3) Continue pureed diet and thin liquids as before.            Medication List         ciprofloxacin 500 MG tablet  Commonly known as:  CIPRO  Take 1 tablet (500 mg total) by mouth 2 (two) times daily.     haloperidol 2 MG tablet  Commonly known as:  HALDOL  Take 4 mg by mouth every 4 (four) hours as needed for agitation (agitation).     LORazepam 1 MG tablet  Commonly known as:  ATIVAN  Take 1 mg by mouth every 4 (four) hours as needed for anxiety (agitation).     morphine 20 MG/ML concentrated solution  Commonly known as:  ROXANOL  Take 10 mg by mouth every 2 (two) hours as needed for severe pain (SOB/ Pain).       Follow-up Information   Follow up with Home Hospice will follow..       The results of  significant diagnostics from this hospitalization (including imaging, microbiology, ancillary and laboratory) are listed below for reference.    Significant Diagnostic Studies: [place] Scrotum  06/11/2013   CLINICAL DATA:  Penile trauma, pus at urethral meatus, RIGHT scrotal swelling, lump  EXAM: SCROTAL ULTRASOUND  DOPPLER ULTRASOUND OF THE TESTICLES  TECHNIQUE: Complete ultrasound examination of the testicles, epididymis, and other scrotal structures was performed. Color and spectral Doppler ultrasound were also utilized to evaluate blood flow to the testicles.  COMPARISON:  None  FINDINGS: Right testicle  Measurements: 4.7 x 2.1 x 3.5 cm. Normal echogenicity without mass or calcification. Internal blood flow present on color Doppler imaging.  Left testicle  Measurements: 4.0 x 1.8 x 2.9 cm. Smaller in size than RIGHT. Markedly heterogeneous echogenicity diffusely. No discrete mass or calcification. Internal blood flow present on color Doppler imaging.  Right epididymis:  Normal in size and appearance.  Left epididymis:  Normal in size and appearance.  Hydrocele: Large complex RIGHT hydrocele identified containing diffuse internal echogenicity; this could be due to blood,  pus or debris. Tiny LEFT hydrocele identified.  Varicocele:  Absent bilaterally  Pulsed Doppler interrogation of both testes demonstrates low resistance arterial and venous waveforms bilaterally.  A at the base of the penis, between the testes at a site of palpable abnormality, a 2 anechoic tubular structures are identified, question extending to tubing, suspect penile prosthesis. An additional curvilinear echogenic focus with central hypo echogenicity is identified, with a larger 2.9 cm diameter rounded focus at the end, suspect reservoir to penile prosthesis.  IMPRESSION: Large complex fluid collection or on RIGHT testis, question hydrocele in this clinical setting though hematocele or less likely significant debris could cause a similar  appearance.  Smaller markedly heterogeneous appearing LEFT testis question sequela of prior missed torsion, trauma or infection.  Artifacts at base of penis and scrotum appear to represent foreign bodies, most suspicious for a penile prosthesis; recommend correlation with patient history to exclude other foreign bodies.   Electronically Signed   By: Ulyses Southward M.D.   On: 06/11/2013 02:45   [place] Art/ven Flow Abd Pelv Doppler  06/11/2013   CLINICAL DATA:  Penile trauma, pus at urethral meatus, RIGHT scrotal swelling, lump  EXAM: SCROTAL ULTRASOUND  DOPPLER ULTRASOUND OF THE TESTICLES  TECHNIQUE: Complete ultrasound examination of the testicles, epididymis, and other scrotal structures was performed. Color and spectral Doppler ultrasound were also utilized to evaluate blood flow to the testicles.  COMPARISON:  None  FINDINGS: Right testicle  Measurements: 4.7 x 2.1 x 3.5 cm. Normal echogenicity without mass or calcification. Internal blood flow present on color Doppler imaging.  Left testicle  Measurements: 4.0 x 1.8 x 2.9 cm. Smaller in size than RIGHT. Markedly heterogeneous echogenicity diffusely. No discrete mass or calcification. Internal blood flow present on color Doppler imaging.  Right epididymis:  Normal in size and appearance.  Left epididymis:  Normal in size and appearance.  Hydrocele: Large complex RIGHT hydrocele identified containing diffuse internal echogenicity; this could be due to blood, pus or debris. Tiny LEFT hydrocele identified.  Varicocele:  Absent bilaterally  Pulsed Doppler interrogation of both testes demonstrates low resistance arterial and venous waveforms bilaterally.  A at the base of the penis, between the testes at a site of palpable abnormality, a 2 anechoic tubular structures are identified, question extending to tubing, suspect penile prosthesis. An additional curvilinear echogenic focus with central hypo echogenicity is identified, with a larger 2.9 cm diameter rounded focus at  the end, suspect reservoir to penile prosthesis.  IMPRESSION: Large complex fluid collection or on RIGHT testis, question hydrocele in this clinical setting though hematocele or less likely significant debris could cause a similar appearance.  Smaller markedly heterogeneous appearing LEFT testis question sequela of prior missed torsion, trauma or infection.  Artifacts at base of penis and scrotum appear to represent foreign bodies, most suspicious for a penile prosthesis; recommend correlation with patient history to exclude other foreign bodies.   Electronically Signed   By: Ulyses Southward M.D.   On: 06/11/2013 02:45   Dg Chest Port 1 View  06/13/2013   CLINICAL DATA:  Adventitious lungs sounds.  Shortness of breath.  EXAM: PORTABLE CHEST - 1 VIEW  COMPARISON:  Chest radiograph performed 06/11/2013  FINDINGS: There has been mild interval improvement in diffuse bilateral interstitial opacification. This appears to reflect the patient's chronic underlying interstitial lung disease, though minimal residual edema cannot be excluded. No definite pleural effusion or pneumothorax is seen.  The cardiomediastinal silhouette is borderline normal in size. A pacemaker is seen  overlying the left chest wall, with a single lead ending overlying the right ventricle. No acute osseous abnormalities are identified.  IMPRESSION: Mild interval improvement in diffuse bilateral airspace opacification. This appears to reflect the patient's chronic underlying interstitial lung disease, though minimal residual edema cannot be excluded.   Electronically Signed   By: Roanna Raider M.D.   On: 06/13/2013 04:39   Dg Chest Port 1 View  06/11/2013   CLINICAL DATA:  Severe sepsis.  EXAM: PORTABLE CHEST - 1 VIEW  COMPARISON:  DG CHEST 1V PORT dated 04/01/2013; CT CHEST W/CM dated 07/03/2012  FINDINGS: The cardiac silhouette appears moderately enlarged, similar. Diffuse moderate interstitial prominence, mildly decreased without pleural effusions or  focal consolidations. Mildly elevated right hemidiaphragm. No pneumothorax.  Single lead left cardiac pacemaker in situ. Multiple EKG lines overlie the patient and may obscure subtle underlying pathology. Moderate degenerative change of the thoracic spine.  IMPRESSION: Stable cardiomegaly, with mildly decreased interstitial prominence could reflect resolving pulmonary edema on a background of chronic interstitial lung disease.   Electronically Signed   By: Awilda Metro   On: 06/11/2013 05:42    Microbiology: Recent Results (from the past 240 hour(s))  URINE CULTURE     Status: None   Collection Time    06/10/13 10:47 PM      Result Value Ref Range Status   Specimen Description URINE, RANDOM   Final   Special Requests NONE   Final   Culture  Setup Time     Final   Value: 06/11/2013 04:12     Performed at [facility]   Colony Count     Final   Value: >=100,000 COLONIES/ML     Performed at [facility]   Culture     Final   Value: ESCHERICHIA COLI     KLEBSIELLA PNEUMONIAE     Performed at [facility]   Report Status 06/13/2013 FINAL   Final   Organism ID, Bacteria ESCHERICHIA COLI   Final   Organism ID, Bacteria KLEBSIELLA PNEUMONIAE   Final  CULTURE, BLOOD (ROUTINE X 2)     Status: None   Collection Time    06/11/13  1:08 AM      Result Value Ref Range Status   Specimen Description BLOOD LEFT HAND   Final   Special Requests BOTTLES DRAWN AEROBIC AND ANAEROBIC 5CC   Final   Culture  Setup Time     Final   Value: 06/11/2013 04:00     Performed at [facility]   Culture     Final   Value:        BLOOD CULTURE RECEIVED NO GROWTH TO DATE CULTURE WILL BE HELD FOR 5 DAYS BEFORE ISSUING A FINAL NEGATIVE REPORT     Performed at [facility]   Report Status PENDING   Incomplete  CULTURE, BLOOD (ROUTINE X 2)     Status: None   Collection Time    06/11/13  1:08 AM      Result Value Ref Range Status   Specimen Description BLOOD RIGHT  ANTECUBITAL   Final   Special Requests BOTTLES DRAWN AEROBIC AND ANAEROBIC 5CC   Final   Culture  Setup Time     Final   Value: 06/11/2013 04:00     Performed at [facility]   Culture     Final   Value:        BLOOD CULTURE RECEIVED NO GROWTH TO DATE CULTURE WILL  BE HELD FOR 5 DAYS BEFORE ISSUING A FINAL NEGATIVE REPORT     Performed at [facility]   Report Status PENDING   Incomplete  MRSA PCR SCREENING     Status: Abnormal   Collection Time    06/11/13  6:17 AM      Result Value Ref Range Status   MRSA by PCR POSITIVE (*) NEGATIVE Final   Comment: RESULT CALLED TO, READ BACK BY AND VERIFIED WITH:     L. STERN RN AT 1610 ON 05.26.15 BY SHUEA                The GeneXpert MRSA Assay (FDA     approved for NASAL specimens     only), is one component of a     comprehensive MRSA colonization     surveillance program. It is not     intended to diagnose MRSA     infection nor to guide or     monitor treatment for     MRSA infections.     Labs: Basic Metabolic Panel:  Recent Labs Lab 06/10/13 2227 06/12/13 0348 06/13/13 0337  NA 142 146 151*  K 3.2* 3.1* 2.8*  CL 95* 106 109  CO2 28 25 28   GLUCOSE 166* 103* 120*  BUN 29* 19 14  CREATININE 0.50 0.42* 0.47*  CALCIUM 9.4 8.7 8.7   Liver Function Tests:  Recent Labs Lab 06/10/13 2227  AST 12  ALT 8  ALKPHOS 96  BILITOT 2.5*  PROT 7.3  ALBUMIN 2.9*   No results found for this basename: LIPASE, AMYLASE,  in the last 168 hours No results found for this basename: AMMONIA,  in the last 168 hours CBC:  Recent Labs Lab 06/10/13 2227 06/12/13 0348 06/13/13 0337  WBC 26.8* 14.6* 11.8*  NEUTROABS 24.2*  --   --   HGB 13.1 10.4* 10.7*  HCT 39.0 31.2* 33.5*  MCV 97.3 98.7 100.0  PLT 283 212 179   Cardiac Enzymes: No results found for this basename: CKTOTAL, CKMB, CKMBINDEX, TROPONINI,  in the last 168 hours BNP: BNP (last 3 results)  Recent Labs  03/27/13 1028 03/28/13 0202 03/31/13 0238   PROBNP 2511.0* 4057.0* 3242.0*  3318.0*   CBG: No results found for this basename: GLUCAP,  in the last 168 hours   Signed:  Elease Etienne, MD, FACP, [facility]. [facility] Pager [phone]  If 7PM-7AM, please contact night-coverage [email or web address] Password [email or web address] 06/14/2013, 1:41 PM

## 2013-06-14 NOTE — Progress Notes (Signed)
Pt leaving at this time via EMS. Pt being transferred to his daughter's house with Hospice care. Writer alerted daughter that pt is leaving at this time.

## 2013-06-14 NOTE — Care Management Note (Unsigned)
    Page 1 of 1   06/14/2013     1:56:58 PM CARE MANAGEMENT NOTE 06/14/2013  Patient:  Billy Casey, Billy Casey   Account Number:  [id number]  Date Initiated:  06/11/2013  Documentation initiated by:  DAVIS,RHONDA  Subjective/Objective Assessment:   elderly male with sepsis -urinary admitted due to penile purlent discharge-history of implant,and scrotal swelling,     Action/Plan:   from home reportly has a cna for daily care, but patient was found to be very unclean and unkempt.  Supposely he lives with his adult children but they seemed to be suprised by his state.   Anticipated DC Date:  06/14/2013   Anticipated DC Plan:  HOME W [facility] CARE         Choice offered to / List presented to:             Status of service:  Completed, signed off Medicare Important Message given?   (If response is "NO", the following Medicare IM given date fields will be blank) Date Medicare IM given:   Date Additional Medicare IM given:    Discharge Disposition:  HOME W HOSPICE CARE  Per UR Regulation:  Reviewed for med. necessity/level of care/duration of stay  If discussed at Long Length of Stay Meetings, dates discussed:    Comments:

## 2013-06-17 ENCOUNTER — Telehealth: Payer: Self-pay | Admitting: Family

## 2013-06-17 ENCOUNTER — Ambulatory Visit (INDEPENDENT_AMBULATORY_CARE_PROVIDER_SITE_OTHER): Payer: Medicare Other | Admitting: General Practice

## 2013-06-17 DIAGNOSIS — I4891 Unspecified atrial fibrillation: Secondary | ICD-10-CM

## 2013-06-17 LAB — CULTURE, BLOOD (ROUTINE X 2)
Culture: NO GROWTH
Culture: NO GROWTH

## 2013-06-17 NOTE — Telephone Encounter (Signed)
[facility] is calling to inform Billy Casey that pt expired on 2013/07/08 at 1:30 am.

## 2013-06-17 NOTE — Progress Notes (Signed)
Pre visit review using our clinic review tool, if applicable. No additional management support is needed unless otherwise documented below in the visit note. 

## 2013-06-17 NOTE — Telephone Encounter (Signed)
FYI

## 2013-06-17 DEATH — deceased

## 2014-08-19 IMAGING — CR DG CHEST 1V PORT
1 series · 1 of 1 positions shown · non-contrast
Comparison: No priors.

CLINICAL DATA: Shortness of breath.  Respiratory distress.

PORTABLE CHEST - 1 VIEW

[AP]
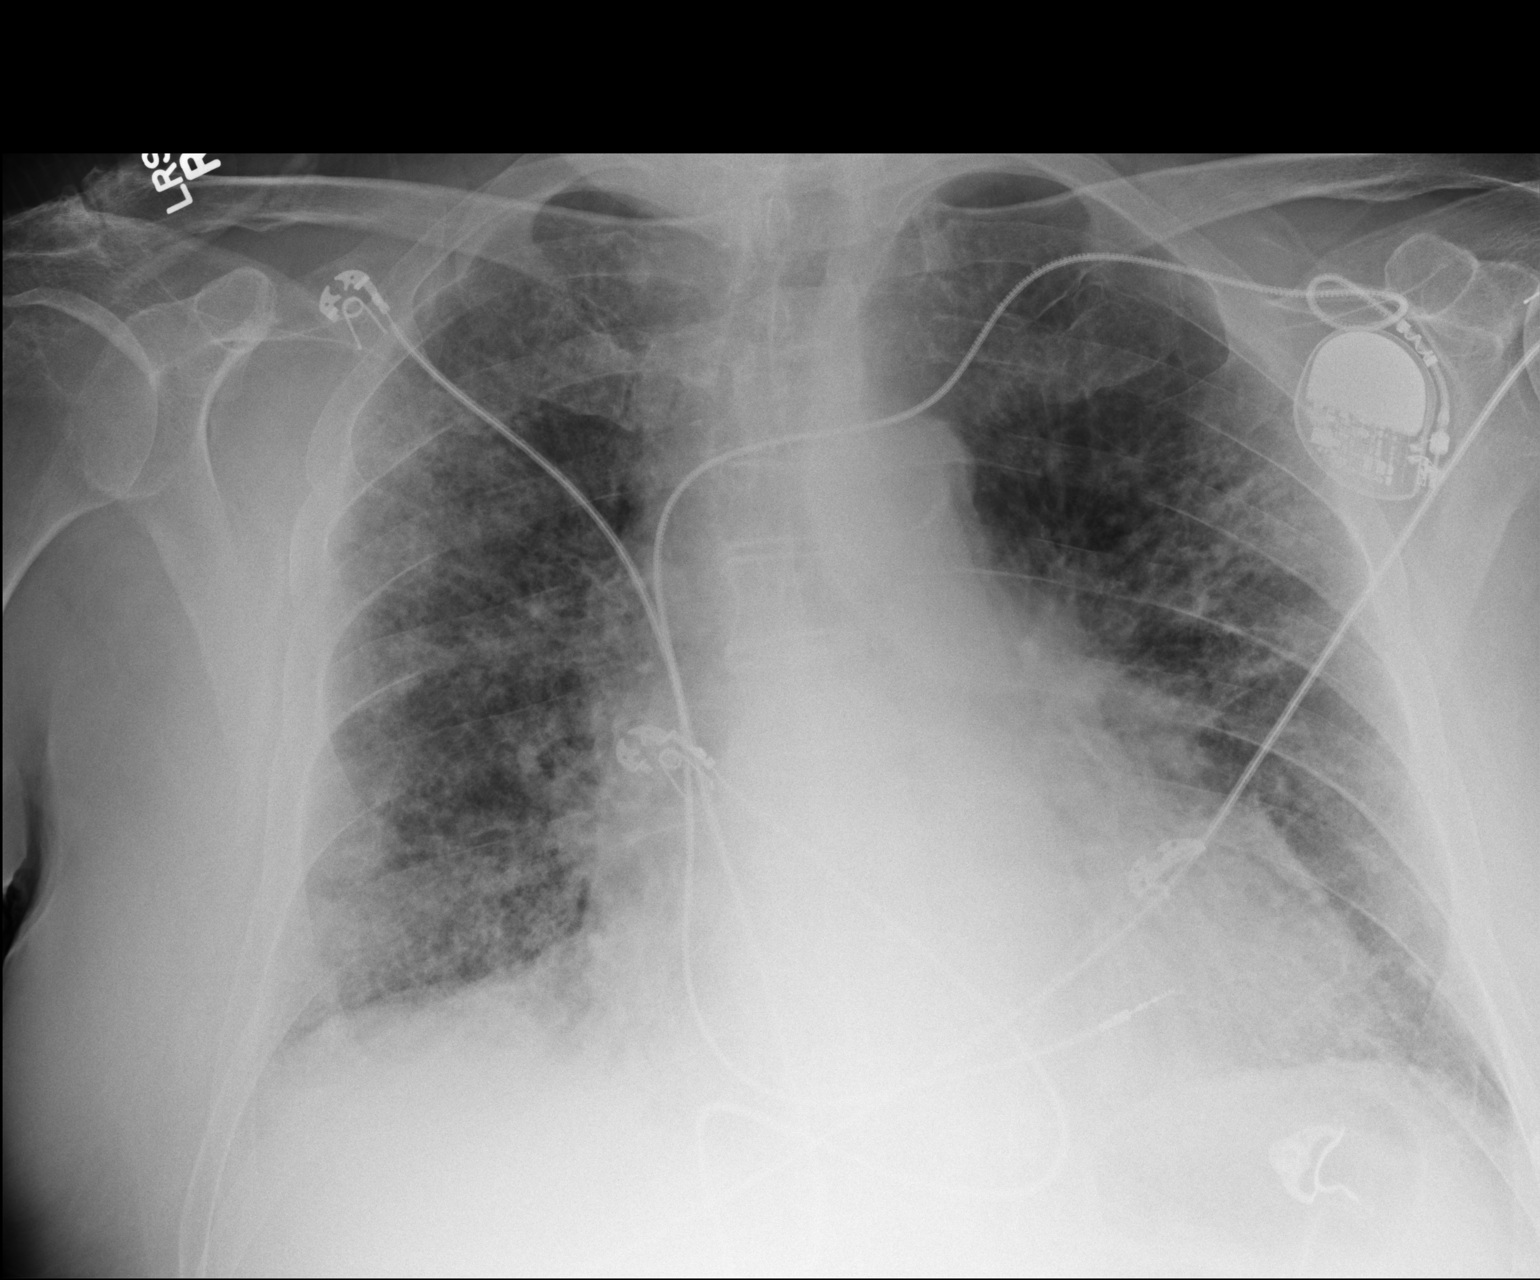

[1 of 1 positions shown; findings below may reference images not displayed]

FINDINGS: Mild diffuse interstitial prominence and extensive
peribronchial cuffing.  Bibasilar opacities favored to
predominately reflect subsegmental atelectasis.  No definite
pleural effusions.  Mild congestion of the pulmonary vasculature.
Heart size is mildly enlarged. The patient is rotated to the left
on today's exam, resulting in distortion of the mediastinal
contours and reduced diagnostic sensitivity and specificity for
mediastinal pathology.  Atherosclerosis in the thoracic aorta.
Left-sided pacemaker device in place with lead tip projecting over
the expected location of the right ventricular apex.
IMPRESSION: 1.  Diffuse peribronchial cuffing and interstitial prominence,
concerning for severe bronchitis, potentially with developing
multifocal bronchopneumonia.
2.  Mild cardiomegaly with pulmonary venous congestion.
3.  Atherosclerosis.

## 2015-05-14 IMAGING — CT CT HEAD W/O CM
2 series · 16 of 30 positions shown, 18 images · non-contrast
Comparison: none

[Series 2: head w/o · axial · non-contrast · 0.49mm/px · z∈[+73,+208]mm · 8 of 35 slices shown, 10 images]
[im 4/35  brain]
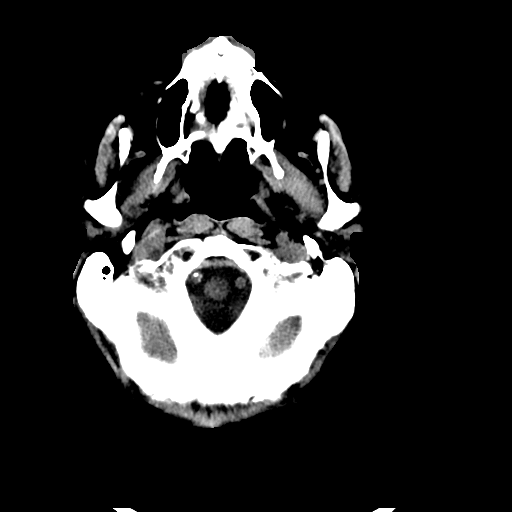
[im 4/35  bone]
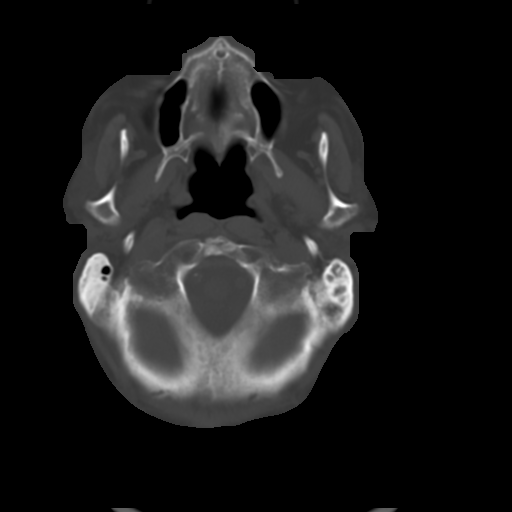
[im 8/35  brain]
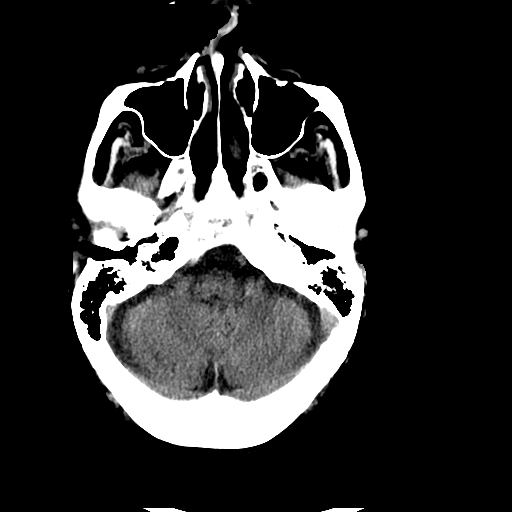
[im 12/35  brain]
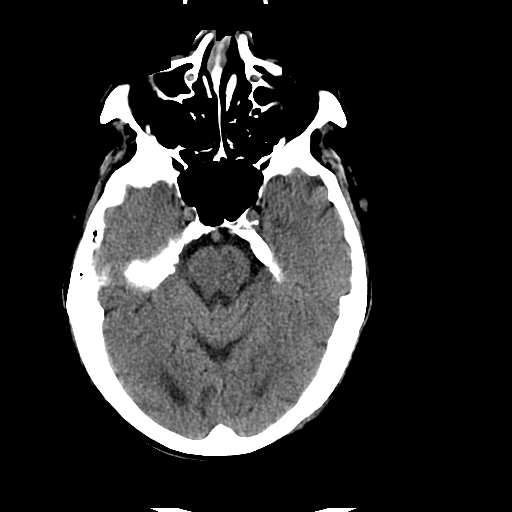
[im 16/35  brain]
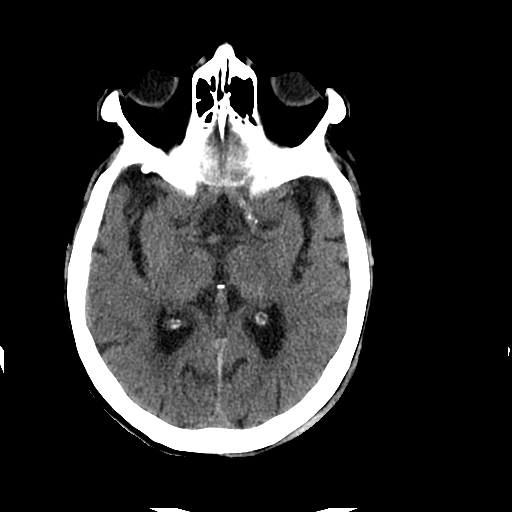
[im 19/35  brain]
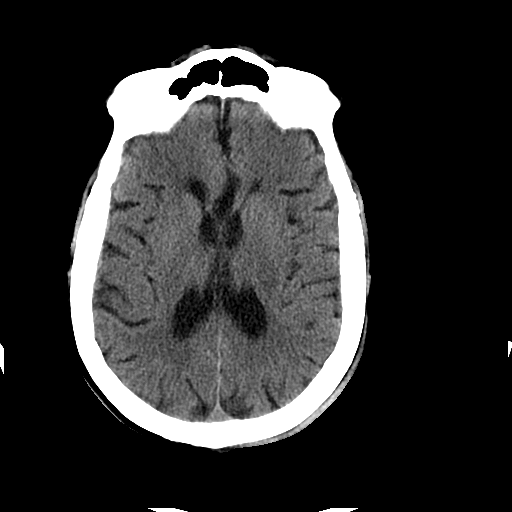
[im 19/35  bone]
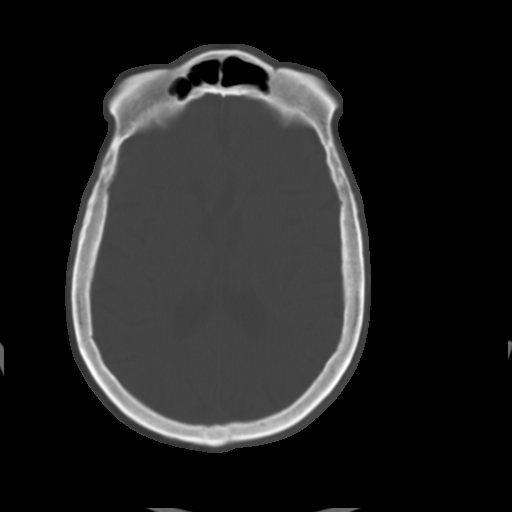
[im 23/35  brain]
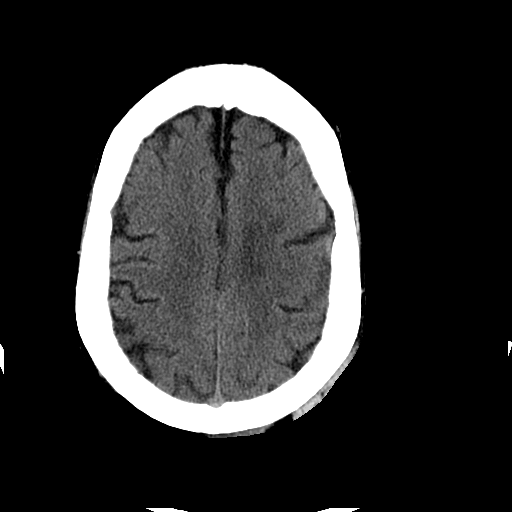
[im 27/35  brain]
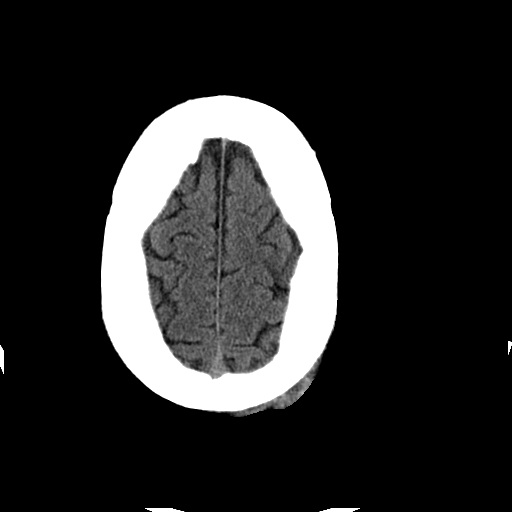
[im 31/35  brain]
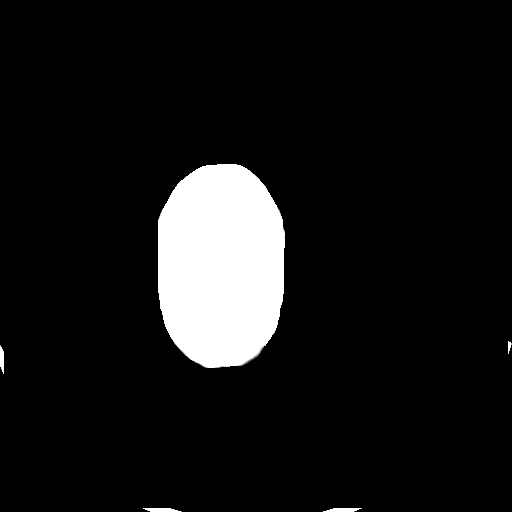

[Series 3: head w/o bone · axial · non-contrast · 0.49mm/px · z∈[+76,+208]mm · 8 of 69 slices shown]
[im 8/69  bone]
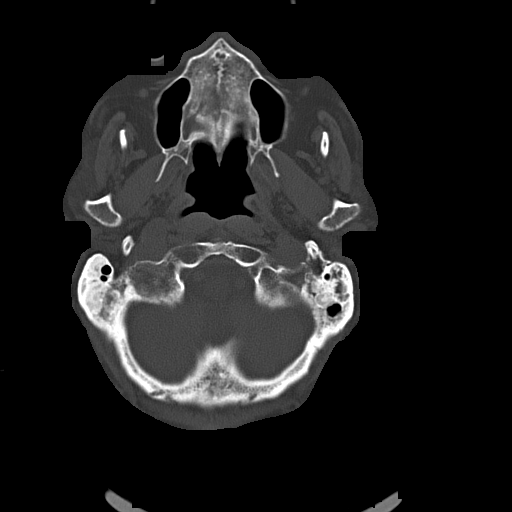
[im 15/69  bone]
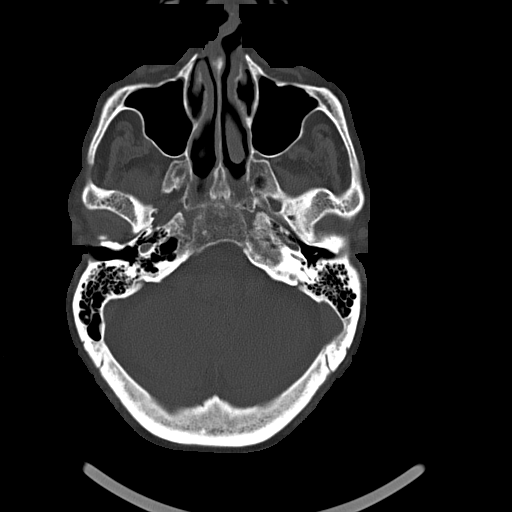
[im 22/69  bone]
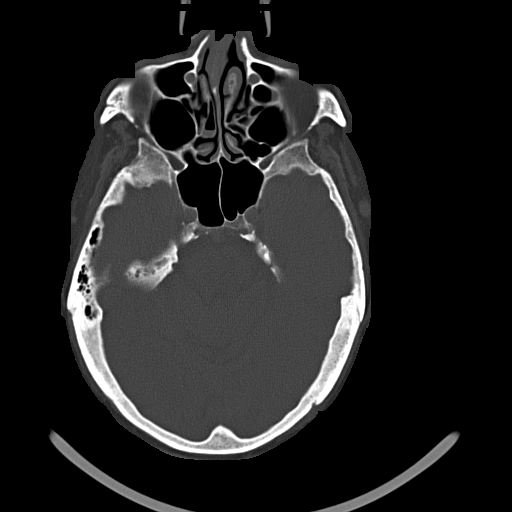
[im 29/69  bone]
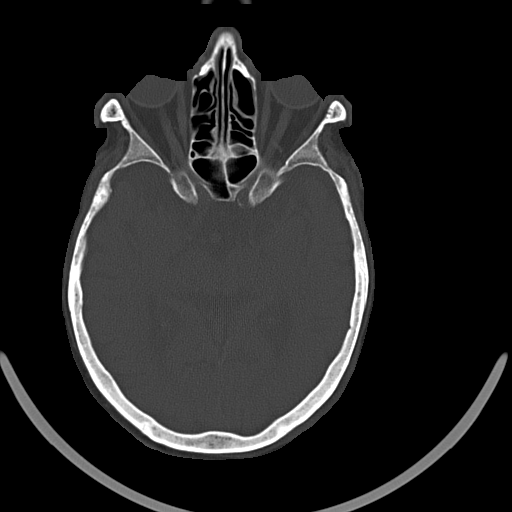
[im 40/69  bone]
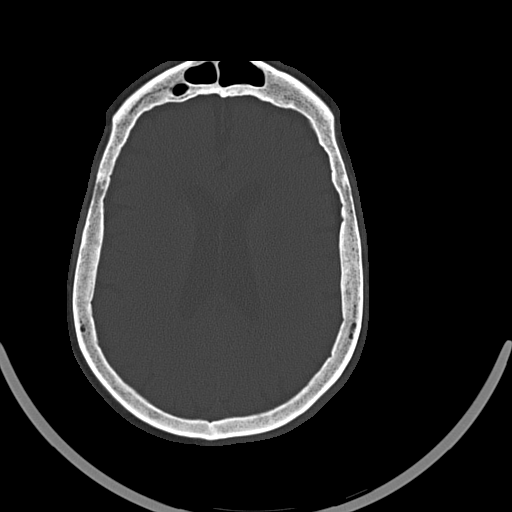
[im 47/69  bone]
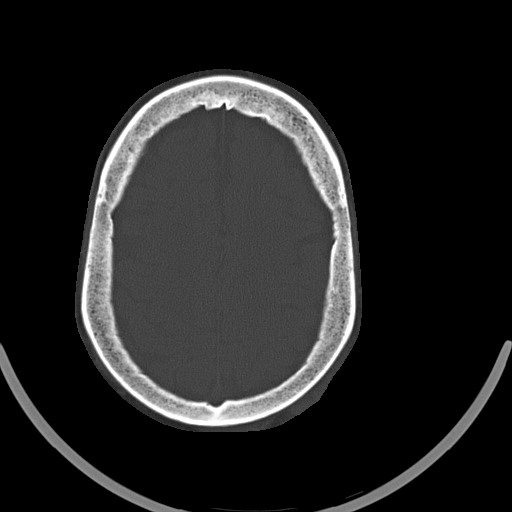
[im 54/69  bone]
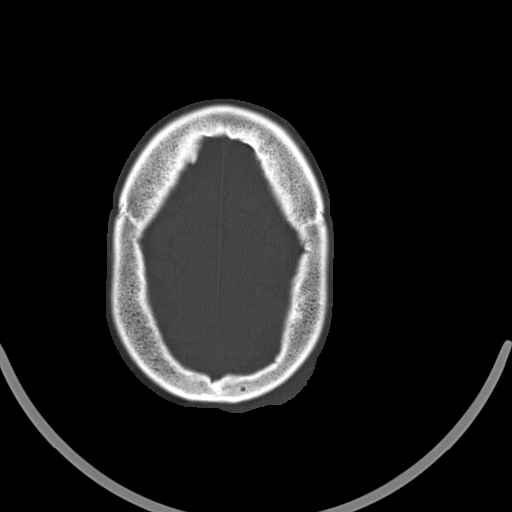
[im 61/69  bone]
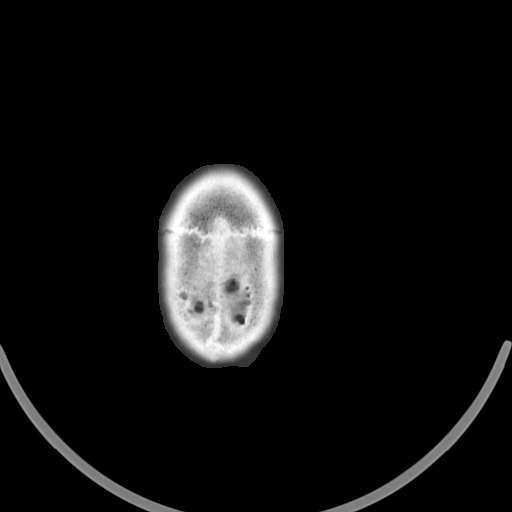

[16 of 30 positions shown; findings below may reference images not displayed]

CLINICAL DATA
Patient fell walking to the car and can't remember the fall, no loss
of consciousness reported by a witness, dizziness

EXAM
CT HEAD WITHOUT CONTRAST

TECHNIQUE
Contiguous axial images were obtained from the base of the skull
through the vertex without intravenous contrast.

COMPARISON
None.

FINDINGS
No skull fracture. There is a moderate scalp hematoma posteriorly
over the left parieto-occipital region. There is mild to moderate
diffuse atrophy and mild low attenuation in the deep white matter.
No evidence of vascular territory infarct. There is a 9 mm oval
focus of hyperattenuation in the falx centrally above the level of
the ventricles consistent with a tiny subdural hematoma. There is no
other evidence of extra-axial fluid and there is no parenchymal
hematoma identified. There is no hydrocephalus.

IMPRESSION
9 mm focal subdural hematoma. Critical Value/emergent results were
called by telephone at the time of interpretation on 03/27/2013 at
[DATE] to Dr. HERSHEL AMADO , who verbally acknowledged these
results.

SIGNATURE

## 2015-05-14 IMAGING — CR DG CHEST 1V
1 series · 1 of 1 positions shown · non-contrast
Comparison: none

[x chest ap]
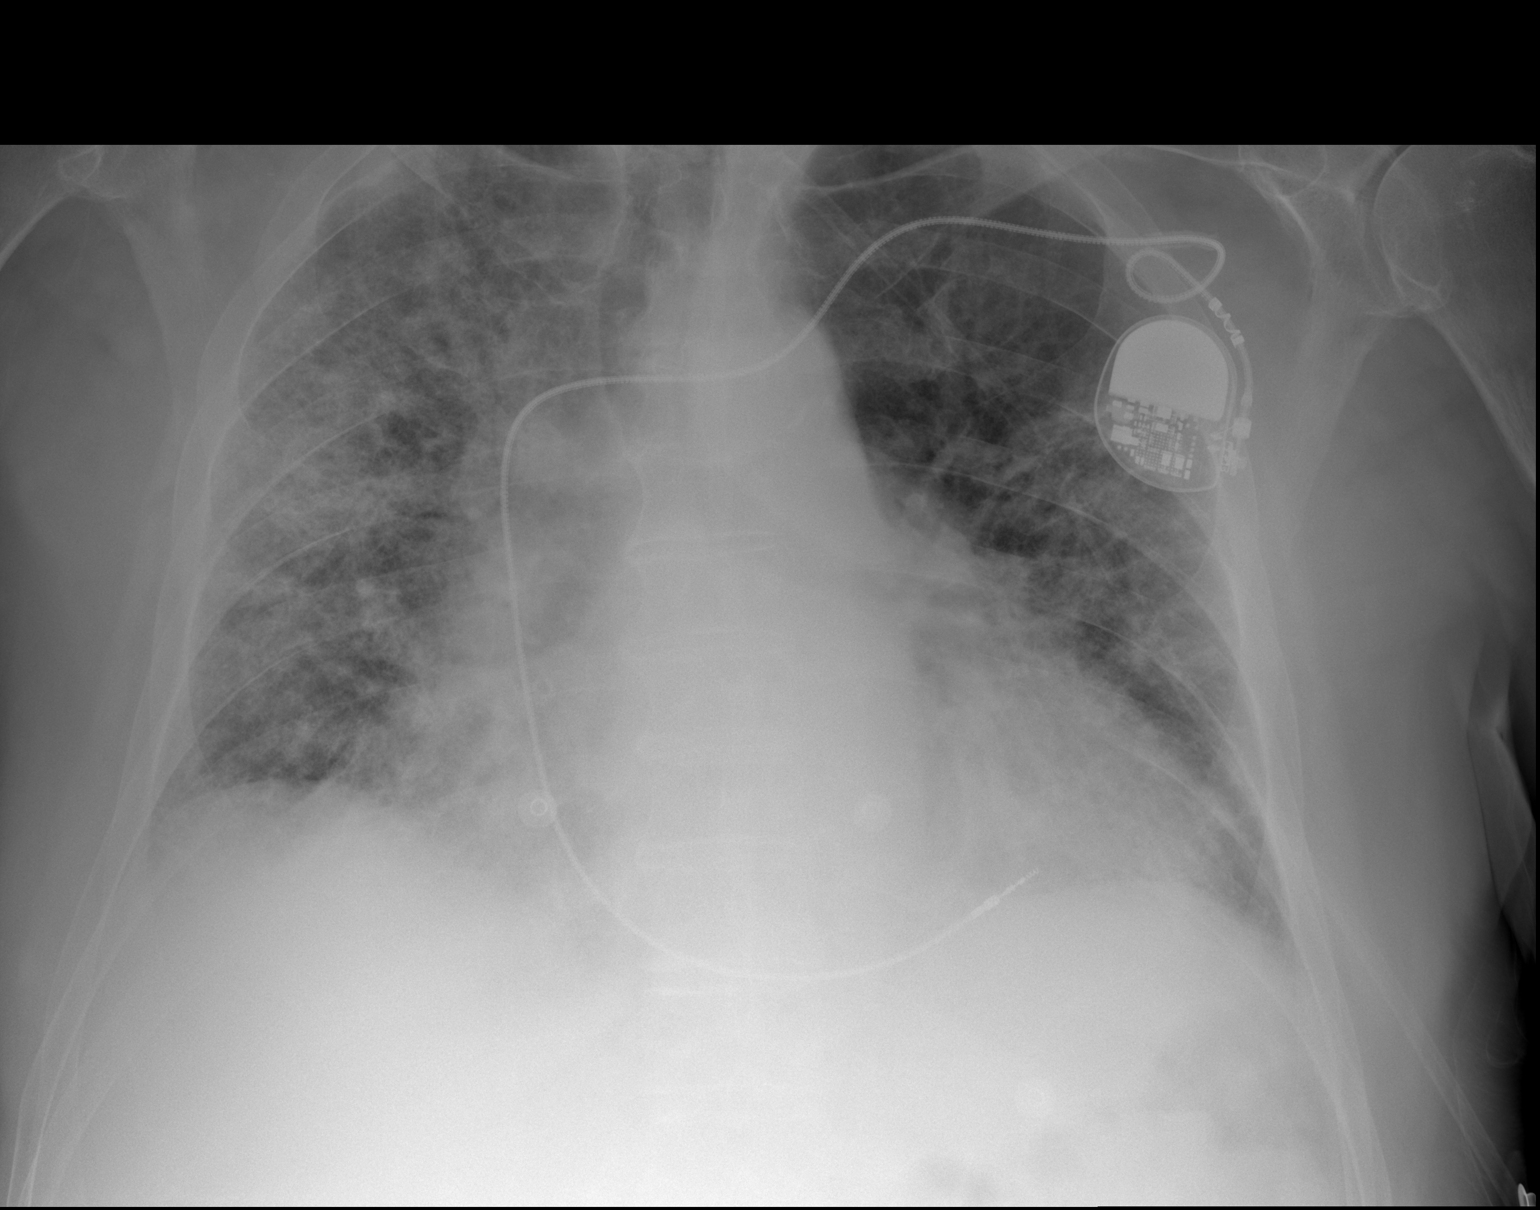

[1 of 1 positions shown; findings below may reference images not displayed]

CLINICAL DATA
Short of breath

EXAM
CHEST - 1 VIEW

COMPARISON
DG CHEST 2 VIEW dated 03/11/2013; CT CHEST W/CM dated 07/03/2012

FINDINGS
There are bilateral patchy interstitial and alveolar airspace
opacities more confluent in the right upper lobe. There is no
pleural effusion or pneumothorax. Stable cardiomegaly. Single lead
cardiac pacer. Unremarkable osseous structures.

IMPRESSION
Bilateral patchy interstitial and alveolar airspace opacities more
confluent in the right upper lobe. There is likely an element of
underlying chronic interstitial lung disease. The appearance is
concerning for superimposed mild interstitial edema or atypical
infection.

SIGNATURE
